# Patient Record
Sex: Female | Born: 1945 | Race: White | Hispanic: No | Smoking: Former smoker
Health system: Southern US, Community
[De-identification: ages and names within clinical notes are randomized; demographics above are authoritative.]

## PROBLEM LIST (undated history)

## (undated) DIAGNOSIS — G8929 Other chronic pain: Secondary | ICD-10-CM

## (undated) DIAGNOSIS — R42 Dizziness and giddiness: Secondary | ICD-10-CM

## (undated) DIAGNOSIS — E785 Hyperlipidemia, unspecified: Secondary | ICD-10-CM

## (undated) DIAGNOSIS — M1990 Unspecified osteoarthritis, unspecified site: Secondary | ICD-10-CM

## (undated) DIAGNOSIS — Z8542 Personal history of malignant neoplasm of other parts of uterus: Secondary | ICD-10-CM

## (undated) DIAGNOSIS — J449 Chronic obstructive pulmonary disease, unspecified: Secondary | ICD-10-CM

## (undated) DIAGNOSIS — F419 Anxiety disorder, unspecified: Secondary | ICD-10-CM

## (undated) DIAGNOSIS — I10 Essential (primary) hypertension: Secondary | ICD-10-CM

## (undated) DIAGNOSIS — Z972 Presence of dental prosthetic device (complete) (partial): Secondary | ICD-10-CM

## (undated) DIAGNOSIS — M797 Fibromyalgia: Secondary | ICD-10-CM

## (undated) HISTORY — PX: KNEE SURGERY: SHX244

---

## 2012-08-09 ENCOUNTER — Inpatient Hospital Stay: Payer: Self-pay | Admitting: Internal Medicine

## 2012-08-09 LAB — URINALYSIS, COMPLETE
Bilirubin,UR: NEGATIVE
Glucose,UR: NEGATIVE mg/dL (ref 0–75)
Leukocyte Esterase: NEGATIVE
Nitrite: NEGATIVE
RBC,UR: 1 /HPF (ref 0–5)
WBC UR: <1 /HPF (ref 0–5)

## 2012-08-09 LAB — CK TOTAL AND CKMB (NOT AT ARMC)
CK, Total: 54 U/L (ref 21–215)
CK-MB: <0.5 ng/mL — ABNORMAL LOW (ref 0.5–3.6)
CK-MB: 0.7 ng/mL (ref 0.5–3.6)

## 2012-08-09 LAB — CBC
MCH: 31.6 pg (ref 26.0–34.0)
MCHC: 34.7 g/dL (ref 32.0–36.0)
Platelet: 197 10*3/uL (ref 150–440)
RDW: 13.5 % (ref 11.5–14.5)
WBC: 8.2 10*3/uL (ref 3.6–11.0)

## 2012-08-09 LAB — COMPREHENSIVE METABOLIC PANEL
Alkaline Phosphatase: 97 U/L (ref 50–136)
Anion Gap: 9 (ref 7–16)
BUN: 17 mg/dL (ref 7–18)
Bilirubin,Total: 0.2 mg/dL (ref 0.2–1.0)
Calcium, Total: 8.5 mg/dL (ref 8.5–10.1)
Chloride: 105 mmol/L (ref 98–107)
EGFR (African American): >60
Potassium: 4.2 mmol/L (ref 3.5–5.1)
SGOT(AST): 24 U/L (ref 15–37)
SGPT (ALT): 18 U/L (ref 12–78)
Total Protein: 6.5 g/dL (ref 6.4–8.2)

## 2012-08-09 LAB — DRUG SCREEN, URINE
Amphetamines, Ur Screen: NEGATIVE (ref ?–1000)
Barbiturates, Ur Screen: NEGATIVE (ref ?–200)
Cocaine Metabolite,Ur ~~LOC~~: NEGATIVE (ref ?–300)
Methadone, Ur Screen: NEGATIVE (ref ?–300)
Tricyclic, Ur Screen: NEGATIVE (ref ?–1000)

## 2012-08-09 LAB — ETHANOL
Ethanol %: 0.157 % — ABNORMAL HIGH (ref 0.000–0.080)
Ethanol: 157 mg/dL

## 2012-08-09 LAB — TSH: Thyroid Stimulating Horm: 1.78 u[IU]/mL

## 2012-08-09 LAB — ACETAMINOPHEN LEVEL: Acetaminophen: 20 ug/mL

## 2012-08-10 LAB — CBC WITH DIFFERENTIAL/PLATELET
Basophil %: 0.6 %
Eosinophil #: 0.2 10*3/uL (ref 0.0–0.7)
Eosinophil %: 1.9 %
HCT: 35.9 % (ref 35.0–47.0)
HGB: 12.4 g/dL (ref 12.0–16.0)
Lymphocyte #: 2.7 10*3/uL (ref 1.0–3.6)
MCV: 92 fL (ref 80–100)
Monocyte %: 6.6 %
Neutrophil #: 6.4 10*3/uL (ref 1.4–6.5)
Platelet: 195 10*3/uL (ref 150–440)
RBC: 3.92 10*6/uL (ref 3.80–5.20)
WBC: 10 10*3/uL (ref 3.6–11.0)

## 2012-08-10 LAB — HEPATIC FUNCTION PANEL A (ARMC)
Albumin: 3.7 g/dL (ref 3.4–5.0)
Bilirubin, Direct: <0.05 mg/dL (ref 0.00–0.20)
Bilirubin,Total: 0.5 mg/dL (ref 0.2–1.0)
SGOT(AST): 19 U/L (ref 15–37)
Total Protein: 6.6 g/dL (ref 6.4–8.2)

## 2012-08-10 LAB — TROPONIN I: Troponin-I: <0.02 ng/mL

## 2012-08-10 LAB — BASIC METABOLIC PANEL
Calcium, Total: 8.8 mg/dL (ref 8.5–10.1)
Chloride: 109 mmol/L — ABNORMAL HIGH (ref 98–107)
Co2: 28 mmol/L (ref 21–32)
EGFR (Non-African Amer.): >60
Osmolality: 286 (ref 275–301)
Potassium: 4.5 mmol/L (ref 3.5–5.1)
Sodium: 144 mmol/L (ref 136–145)

## 2012-08-11 ENCOUNTER — Inpatient Hospital Stay: Payer: Self-pay | Admitting: Psychiatry

## 2015-04-18 NOTE — Consult Note (Signed)
PATIENT NAME:  Caroline Miller, Caroline Miller MR#:  [id number] DATE OF BIRTH:  [birth year]  DATE OF CONSULTATION:  08/10/2012  REFERRING PHYSICIAN:  Katharina Caper, MD   CONSULTING PHYSICIAN:  Jolanta B. Pucilowska, MD  REASON FOR CONSULTATION: To evaluate a patient after a suicide attempt.   IDENTIFYING DATA: Ms. Weatherwax is a 69 year old female with history of depression and alcohol abuse.   CHIEF COMPLAINT: "It is my son."   HISTORY OF PRESENT ILLNESS: Ms. Schifano reports that she has not had a history of depression or anxiety. She also denies any problems with alcohol, illicit drugs, or prescription pills. She moved to [place] from [place] five years ago to be closer to her son. Initially, they lived independently. Later on, the son decided to buy a house and convinced the mother to move in with him. Apparently the relationship was bearable until six months ago when he moved in his girlfriend to live with them. There is a continuous conflict between the mother and the girlfriend. The patient feels that the new girlfriend is disrespectful and does not care about her. She denies drinking and denies having any problems herself. Her son, however, reports that the patient came from [place] with a gun and lately has been threatening suicide frequently, especially on her 69th birthday. She also keeps the gun around, and on the day of admission she hid the gun under the couch while threatening suicide when drunk. When the patient went to take care of commitment papers, the patient overdosed on painkillers that belonged to him. She was brought to the Emergency Room and had to be admitted to the Critical Care Unit. The patient denies any symptoms of depression, anxiety, or psychosis. She is just extremely upset with the girlfriend. She is uncertain whether she wants to stay in her son's house; however, it seems that they have to put their resources together in order to afford this house. She pays him over $500 each month. She believes  that she could easily find an apartment for the same price.   PAST PSYCHIATRIC HISTORY: She denies any prior hospitalization, suicide attempts or substance abuse. She has not been prescribed medications for depression. She has Ambien that is prescribed by her primary provider. She also receives pain medications from the same provider. She did not want to tell me the name as she fears that I would call the doctor and make her stop prescribing pain medication.   FAMILY PSYCHIATRIC HISTORY: None reported.  PAST MEDICAL HISTORY:  1. Hypertension. 2. Chronic pain.                ALLERGIES: No known drug allergies.   MEDICATIONS ON ADMISSION:  1. Vicodin 5/325, 1 to 2 tablets as needed. She has 60 a month. 2. Hydrochlorothiazide 12.5 mg daily.  3. Lisinopril 40 mg daily.  4. Omeprazole 20 mg twice daily.  5. Ambien 10 mg at bedtime.   SOCIAL HISTORY: As above, she is widowed. She has been in [place] for five years. She is rather unhappy with her current arrangements here. She has Medicare.   REVIEW OF SYSTEMS: CONSTITUTIONAL: No fevers or chills. No weight changes. EYES: No double or blurred vision. ENT: No hearing loss. RESPIRATORY: No shortness of breath or cough. CARDIOVASCULAR: No chest pain or orthopnea. GASTROINTESTINAL: No abdominal pain, nausea, vomiting, or diarrhea. GU: No incontinence or frequency. ENDOCRINE: No heat or cold intolerance. LYMPHATIC: No anemia or easy bruising. INTEGUMENTARY: No acne or rash. MUSCULOSKELETAL: Positive for muscle and joint pain  all over. NEUROLOGIC: No tingling or weakness. PSYCHIATRIC: See history of present illness for details.   PHYSICAL EXAMINATION:  VITAL SIGNS: Blood pressure 131/74, pulse 74, respirations 14, temperature 98.2.   GENERAL: This is a slightly obese female in no acute distress. The rest of the physical examination is deferred to her primary attending.   LABORATORY, DIAGNOSTIC AND RADIOLOGICAL DATA: Chemistries are within normal  limits. Blood alcohol level on admission 0.157. LFTs within normal limits. Cardiac enzymes negative x3. TSH 1.78. Urine tox screen positive for opiates. CBC within normal limits. Urinalysis is not suggestive of urinary tract infection. Serum acetaminophen and salicylates are low. EKG: Normal sinus rhythm, normal EKG.   MENTAL STATUS EXAMINATION: The patient is alert and oriented to person, place, time, and situation. She is pleasant, polite, and cooperative. She is in the Critical Care Unit wearing a hospital gown. She maintains good eye contact. Her speech is soft. Mood is depressed with flat affect. Thought processing is logical and goal oriented. Thought content: She denies suicidal or homicidal ideation but was admitted after a suicide attempt by pain killer overdose. She has no delusions or paranoia. There are no auditory or visual hallucinations. Her cognition is grossly intact. She registers three out of three and recalls three out of three objects after five minutes. She can spell world forward and backward. She knows the current president. Her insight and judgment are questionable.   SUICIDE RISK ASSESSMENT: This is a patient with a new onset depression who is also drinking and who overdosed on medications while drunk and upset with her son.   DIAGNOSES:  AXIS I:  1. Adjustment disorder with depressed mood. 2. Alcohol abuse.   AXIS II: Deferred.   AXIS III:  1. Hypertension.  2. Fibromyalgia.   AXIS IV: Mental illness, substance abuse, family conflict.    AXIS V: Global Assessment of Functioning score is 25.   PLAN:  1. The patient is not glad that she is alive. Please continue sitter.  2. We will transfer to Psychiatry as soon as a bed is available.   ____________________________ Ellin GoodieJolanta B. Jennet MaduroPucilowska, MD jbp:cbb D: 08/10/2012 19:50:05 ET T: 08/11/2012 10:25:45 ET JOB#: [id number]  cc: Jolanta B. Jennet MaduroPucilowska, MD, <Dictator> Shari ProwsJOLANTA B PUCILOWSKA MD ELECTRONICALLY SIGNED  08/13/2012 23:03

## 2015-04-18 NOTE — Discharge Summary (Signed)
PATIENT NAME:  Caroline Miller, Caroline Miller MR#:  [id number] DATE OF BIRTH:  July 03, 1946  DATE OF ADMISSION:  08/09/2012 DATE OF DISCHARGE:  08/11/2012  ADMITTING DIAGNOSES:  1. Suicidal ideation. 2. Alcohol dependence. 3. Altered mental status secondary to alcohol intoxication as well as drug overdose with Ambien and narcotics.   DISCHARGE DIAGNOSES:  1. Metabolic encephalopathy, resolved. 2. Alcohol as well as opiate intoxication and Ambien overdose, suicidal ideation.  3. Anemia.  4. Acidosis metabolic. 5. Relative hypotension.  6. Mild obesity.  7. History of hypertension.   DISCHARGE CONDITION: Stable.   DISCHARGE MEDICATIONS: Patient is to resume her outpatient medications which are:  Omeprazole 20 mg p.o. twice daily.   ADDITIONAL MEDICATIONS: Patient is to hold lisinopril as well as HCTZ for relatively low normal blood pressure. Resume blood pressure medications according to medical doctors recommendations.  DIET: 2 gram salt, low fat, low cholesterol, regular consistency.   ACTIVITY LIMITATIONS: As tolerated.   FOLLOW UP: Follow-up appointment with [facility] in two days after discharge.   CONSULTANTS:  1. Dr. Jennet MaduroPucilowska. 2. Care management.   HISTORY OF PRESENT ILLNESS: Patient is a 69 year old Caucasian female with past medical history significant for history of hypertension, depression, alcohol dependence who presented to the hospital with altered mental status as well as suicidal ideation. Apparently she was depressed and felt that nobody was taking care of her. She drank alcohol and had conflicts with her son. Please refer to Dr. Antionette PolesAbrol's admission note on 08/09/2012. Patient wanted to end it all and she was about to harm herself with gun but son confiscated gun so she decided to overdose on prescription medications. She took some narcotic medications as well as her Ambien. On arrival to Emergency Room patient's temperature was not measured, pulse 94, respiration rate 20, blood  pressure 115/57, physical exam was unremarkable.   LABORATORY, DIAGNOSTIC AND RADIOLOGICAL DATA: Chest, portable single view 08/09/2012 showed shallow inspiration with presumed lung base atelectasis.   Lab data done on 08/09/2012 showed normal BMP, elevated alcohol level to 0.157%. Estimated GFR was somewhat low to 52, otherwise unremarkable. Liver enzymes were normal. Cardiac enzymes, first set, as well as subsequent two more sets, were within limits. TSH was normal at 1.78. Urine drug screen was positive for opiates. CBC: White blood cell count 8.2, hemoglobin 11.9, platelet count 197. Urinalysis was within normal limits. Acetaminophen level 20. Salicylate level less than 1.7. ABGs were done on 32% FiO2; pH found to be 7.36, pCO2 40, pO2 124, saturation 98.4%. Lactic acid level was elevated to 2.0, bicarbonate level 25. EKG showed normal sinus rhythm at 80 beats per minute, normal axis, no acute ST-T changes were noted. Chest x-ray was unremarkable.  HOSPITAL COURSE: Patient was admitted to the Intensive Care Unit because of altered mental status, some somnolence as well as intermittent bradycardia. Patient was observed, treated and her opiates as well as Ambien were placed on hold. Because of her blood pressure being low her blood pressure medications were also placed on hold. She was given intermittent Narcan injections with improvement of her mental condition. She was observed and had sitter in the room and was seen by Dr. Jennet MaduroPucilowska, psychiatrist, on 08/10/2012. Dr. Jennet MaduroPucilowska felt that patient has major depressive disorder. Patient overdosed while drunk in the context of family conflict. She was also not glad to be alive. Dr. Jennet MaduroPucilowska recommended to continue sitter and transfer to psychiatry bed as soon as bed is available. A bed became available on 08/11/2012 and patient is going  to be transferred to Dr. Willia Craze care today. On the day of discharge patient's vital signs: Temperature 98.7, pulse  68, respiration rate 18, blood pressure 104/65, saturation 98% on room air at rest. It was felt that patient's blood pressure medications should be kept on hold until her blood pressure improved.   TIME SPENT: 30 minutes.   ____________________________ Katharina Caper, MD rv:cms D: 08/11/2012 08:34:35 ET T: 08/11/2012 09:17:27 ET JOB#: [id number]  cc: Katharina Caper, MD, <Dictator> [facility] Rhea Kaelin MD ELECTRONICALLY SIGNED 08/13/2012 14:49

## 2015-04-18 NOTE — H&P (Signed)
PATIENT NAME:  Caroline Miller, Caroline Miller MR#:  [id number] DATE OF BIRTH:  06-02-1946  DATE OF ADMISSION:  08/09/2012  PRIMARY CARE PHYSICIAN:  None locally.  CHIEF COMPLAINT: Suicidal ideation.   SUBJECTIVE: This is a 69 year old female who was brought to the ER after being committed by her son. She has a history of alcohol dependence and has a history of depression and previous suicidal ideation. She has been committed in the past. However, the patient has also been living in [place] and recently moved to [place] to live with her son.  Apparently she was able to obtain a gun in [place]. The patient has been depressed and feels that there is nobody to take care of her.  The patient's son states that he took her into his home. However, the situation has been extremely difficult for him because the patient tends to get suicidal especially around her birthday. She tends to drink more than usual and continues to throw tantrums as well as continuously threatens that she is going to end her life.  Today the patient stated that she wanted to end it all and had a gun hiding underneath her couch. Her son found the gun and confiscated it. When he left the house to obtain the commitment papers, the patient decided to overdose on prescription medications. The son states that he had some narcotic medication in his medicine closet from a previous abdominal surgery and the patient had those pills in her possession and she overdosed on them. She is also supposed to be on antidepressant medications, but has been noncompliant with them.   PAST MEDICAL HISTORY:  1. Hypertension.  2. Depression.  3. Alcohol dependence.   PAST SURGICAL HISTORY: None.   ALLERGIES: No known drug allergies.   FAMILY HISTORY: Unable to be obtained given the patient's altered mental status.   REVIEW OF SYSTEMS: Again, unable to be obtained given the patient's altered mental status.   SOCIAL HISTORY: The patient lives with her son. Currently  drinks 6 to 12 beers per day. Denies any history of ongoing tobacco use.   HOME MEDICATIONS:  1. Vicodin 5/325, 1 to 2 tablets as needed. 2. HCTZ 12.5 mg p.o. daily.  3. Lisinopril 40 mg p.o. daily.  4. Omeprazole 20 mg p.o. twice daily. 5. Ambien 10 mg 1 tablet at bedtime.   PHYSICAL EXAMINATION:  VITAL SIGNS: Blood pressure 113/57, pulse 94, respirations 20.   GENERAL: Currently the patient refuses to talk to anybody, is awake and easily arousable. Sclerae anicteric.   NECK: Supple. No JVD. Mucous membranes are dry.   HEENT: Atraumatic, normocephalic.  Oropharynx is clear. Pupils are equal and reactive.   NECK: Trachea in the midline. Supple. No thyromegaly. No lymphadenopathy.   LUNGS: Clear to auscultation with normal respiratory effort. No accessory muscle use.   CARDIOVASCULAR: Regular rate and rhythm. No murmurs, rubs, or gallops. PMI is nondisplaced.   ABDOMEN: Soft, nontender, nondistended. No hepatosplenomegaly. Normoactive bowel sounds.   EXTREMITIES: Without dependent edema.   SKIN: Normal temperature and turgor. No rashes. No ulcers. No subcutaneous nodules.   PSYCHIATRIC: Appropriate mood and affect. Oriented to time, place, and person.   LABORATORY DATA: Tylenol level was 20, glucose 89, BUN 17, creatinine 1.17, sodium 139, potassium 4.2, chloride 105, ALT 18, AST 24. EtOH level is 157, WBC 8.2, hemoglobin 11.9, hematocrit 34.4, platelet count 197. TSH is 1.78. Urine drug screen positive for opiates.  Urinalysis negative.   ABG shows a pH of 7.36, pCO2 40, pO2  124.   ASSESSMENT AND PLAN:  1. Suicidal ideation.  2. Alcohol dependence.  3. Altered mental status secondary to probably EtOH intoxication as well as drug overdose with Ambien as well as narcotics.   PLAN:  1. The patient received a couple of doses of Narcan in the ER. The patient also had an NG tube placed and had a gastric aspiration done. The patient has been placed on involuntary commitment.   2. We will admit her to Intensive Care Unit and monitor her closely neurologically because of her unknown history of drug overdose. At this point it is not quite clear what exactly she has taken. We will hold her antihypertensive medications in case the patient has overdosed on her blood pressure medications to avoid hypotension.  3. Will keep her n.p.o. until the patient is awake, alert, cooperative to decrease the risk of aspiration.  4. We will keep her in restraints overnight.  5. Start her on CIWA protocol.  6. Discussed the plan of care with her son.  7. CODE STATUS: She is a FULL CODE.   TIME SPENT: 90 minutes.   ____________________________ Richarda OverlieNayana Leeya Rusconi, MD na:bjt D: 08/09/2012 18:12:27 ET T: 08/10/2012 07:19:30 ET JOB#: [id number]  cc: Richarda OverlieNayana Tajae Maiolo, MD, <Dictator> Richarda OverlieNAYANA Orson Rho MD ELECTRONICALLY SIGNED 08/17/2012 0:52

## 2015-04-18 NOTE — Consult Note (Signed)
Brief Consult Note: Diagnosis: Major depressive disorder.   Patient was seen by consultant.   Consult note dictated.   Recommend further assessment or treatment.   Orders entered.   Discussed with Attending MD.   Comments: Ms. Lemaster overdosed on medications while drunk in the context of family conflict.She is not glad to be alive.   PLAN: 1. Please continue sitter.  2. will transfer to psychiatry as soon as bed available.  Electronic Signatures: Kristine LineaPucilowska, Kyrin Garn (MD)  (Signed 12-Aug-13 19:40)  Authored: Brief Consult Note   Last Updated: 12-Aug-13 19:40 by Kristine LineaPucilowska, Sarae Nicholes (MD)

## 2015-05-09 NOTE — H&P (Signed)
PATIENT NAME:  Caroline Miller, Caroline Miller [id number] OF BIRTH:  08/13/1946 OF ADMISSION:  08/11/2012 PHYSICIAN:  Katharina Caperima Vaickute, MD  PHYSICIAN:  Gertude Benito B. Mac Dowdell, MD DATA: Ms. Caroline Miller is a 69 year old female with history of depression and alcohol abuse.  COMPLAINT: "It is my son."  OF PRESENT ILLNESS: Ms. Caroline Miller reports that she has not had a history of depression or anxiety. She also denies any problems with alcohol, illicit drugs, or prescription pills. She moved to [place] ago to be closer to her son. Initially, they lived independently. Later on, the son decided to buy a house and convinced the mother to move in with him. Apparently the relationship was bearable until six months ago when he moved in his girlfriend to live with them. There is a continuous conflict between the mother and the girlfriend. The patient feels that the new girlfriend is disrespectful and does not care about her. She denies drinking and denies having any problems herself. Her son, however, reports that the patient came from [place] with a gun and lately has been threatening suicide frequently, especially on her birthday. She also keeps the gun around, and on the day of admission she hid the gun under the couch while threatening suicide when drunk. When the patient went to take care of commitment papers, the patient overdosed on painkillers that belonged to him. She was brought to the Emergency Room and had to be admitted to the Critical Care Unit. The patient denies any symptoms of depression, anxiety, or psychosis. She is just extremely upset with the girlfriend. She is uncertain whether she wants to stay in her son's house; however, it seems that they have to put their resources together in order to afford this house. She pays him over $500 each month. She believes that she could easily find an apartment for the same price.  PSYCHIATRIC HISTORY: She denies any prior hospitalization, suicide attempts or substance abuse.  She has not been prescribed medications for depression. She has Ambien that is prescribed by her primary provider. She also receives pain medications from the same provider. She did not want to tell me the name as she fears that I would call the doctor and make her stop prescribing pain medication.  PSYCHIATRIC HISTORY: None reported. MEDICAL HISTORY:  1. Hypertension. 2. Chronic pain.               No known drug allergies.  ON ADMISSION:  1. Omeprazole 20 mg twice daily.  HISTORY: As above, she is widowed. She has been in [place] for five years. She is rather unhappy with her current arrangements here. She has Medicare.  OF SYSTEMS: CONSTITUTIONAL: No fevers or chills. No weight changes. EYES: No double or blurred vision. ENT: No hearing loss. RESPIRATORY: No shortness of breath or cough. CARDIOVASCULAR: No chest pain or orthopnea. GASTROINTESTINAL: No abdominal pain, nausea, vomiting, or diarrhea. GU: No incontinence or frequency. ENDOCRINE: No heat or cold intolerance. LYMPHATIC: No anemia or easy bruising. INTEGUMENTARY: No acne or rash. MUSCULOSKELETAL: Positive for muscle and joint pain all over. NEUROLOGIC: No tingling or weakness. PSYCHIATRIC: See history of present illness for details.   PHYSICAL EXAMINATION: VITAL SIGNS: Blood pressure 131/74, pulse 74, respirations 14, temperature 98.2. GENERAL: This is a slightly obese female in no acute distress. HEENT: The pupils are equal, round, and reactive to light. Sclerae anicteric. NECK: Supple. No thyromegaly. LUNGS: Clear to auscultation. No dullness to percussion. HEART: Regular rhythm and rate. No murmurs, rubs, or gallops. ABDOMEN:  Soft, nontender, nondistended. Positive bowel sounds. MUSCULOSKELETAL: Normal muscle strength in all extremities. SKIN: No rashes or bruises. LYMPHATIC: No cervical adenopathy. NEUROLOGIC: Cranial nerves II through XII are intact.  LABORATORY, DIAGNOSTIC AND RADIOLOGICAL DATA: Chemistries are within normal  limits. Blood alcohol level on admission 0.157. LFTs within normal limits. Cardiac enzymes negative x3. TSH 1.78. Urine tox screen positive for opiates. CBC within normal limits. Urinalysis is not suggestive of urinary tract infection. Serum acetaminophen and salicylates are low. EKG: Normal sinus rhythm, normal EKG.  STATUS EXAMINATION ON ADMISSION: The patient is alert and oriented to person, place, time, and situation. She is pleasant, polite, and cooperative. She is in the Critical Care Unit wearing a hospital gown. She maintains good eye contact. Her speech is soft. Mood is depressed with flat affect. Thought processing is logical and goal oriented. Thought content: She denies suicidal or homicidal ideation but was admitted after a suicide attempt by pain killer overdose. She has no delusions or paranoia. There are no auditory or visual hallucinations. Her cognition is grossly intact. She registers three out of three and recalls three out of three objects after five minutes. She can spell world forward and backward. She knows the current president. Her insight and judgment are questionable.  RISK ASSESSMENT ON ADMISSION: This is a patient with a new onset depression who is also drinking and who overdosed on medications while drunk and upset with her son.  DIAGNOSES: I:  Adjustment disorder with depressed mood.    Alcohol abuse.  AXIS II:  Deferred. III:  Hypertension, Fibromyalgia, GERD.IV:  Mental illness, substance abuse, family conflict. V:  Global Assessment of Functioning on admission 25.   PLAN: The patient was admitted to [facility] Medicine unit for safety, stabilization, and medication management. She was initially placed on suicide precautions and was closely monitored for any unsafe behaviors. She underwent full psychiatric and risk assessment. She received pharmacotherapy, individual and group psychotherapy, substance abuse counseling, and support from  therapeutic milieu.  1. Suicidal ideation: This ha sresolved. The patient is able to contract for safety.   2. Mood. We will start Paxil for depression and Trazodone for insomnia.   Medical. The patient has a history of hypertension. Her antihypertensives were discontinue in CCU due to low blood pressure. We will monitor.   Substance abuse treatment. The patient denies heavy drinking. She does not need detox. She minimizes her problem and declines treatment.   5. Discharge planning. She does not want to return to her son's place and wants to find an apartment.    Electronic Signatures: Kristine LineaPucilowska, Faustine Tates (MD)  (Signed on 14-Aug-13 17:49)  Authored  Last Updated: 14-Aug-13 17:49 by Kristine LineaPucilowska, Olander Friedl (MD)

## 2020-09-29 ENCOUNTER — Inpatient Hospital Stay
Admission: EM | Admit: 2020-09-29 | Discharge: 2020-10-03 | DRG: 281 | Disposition: A | Discharge status: Home / Self Care | Payer: Medicare HMO | Attending: Internal Medicine | Admitting: Internal Medicine

## 2020-09-29 ENCOUNTER — Observation Stay: Payer: Medicare HMO

## 2020-09-29 ENCOUNTER — Encounter: Payer: Self-pay | Admitting: Emergency Medicine

## 2020-09-29 ENCOUNTER — Emergency Department: Payer: Medicare HMO

## 2020-09-29 ENCOUNTER — Observation Stay (HOSPITAL_COMMUNITY)
Admit: 2020-09-29 | Discharge: 2020-09-29 | Disposition: A | Discharge status: Home / Self Care | Payer: Medicare HMO | Attending: Internal Medicine | Admitting: Internal Medicine

## 2020-09-29 DIAGNOSIS — I10 Essential (primary) hypertension: Secondary | ICD-10-CM | POA: Diagnosis present

## 2020-09-29 DIAGNOSIS — F32A Depression, unspecified: Secondary | ICD-10-CM | POA: Diagnosis present

## 2020-09-29 DIAGNOSIS — E871 Hypo-osmolality and hyponatremia: Secondary | ICD-10-CM | POA: Diagnosis present

## 2020-09-29 DIAGNOSIS — R4182 Altered mental status, unspecified: Secondary | ICD-10-CM

## 2020-09-29 DIAGNOSIS — N179 Acute kidney failure, unspecified: Secondary | ICD-10-CM | POA: Diagnosis present

## 2020-09-29 DIAGNOSIS — I361 Nonrheumatic tricuspid (valve) insufficiency: Secondary | ICD-10-CM | POA: Diagnosis not present

## 2020-09-29 DIAGNOSIS — I214 Non-ST elevation (NSTEMI) myocardial infarction: Secondary | ICD-10-CM | POA: Diagnosis present

## 2020-09-29 DIAGNOSIS — Z6837 Body mass index (BMI) 37.0-37.9, adult: Secondary | ICD-10-CM

## 2020-09-29 DIAGNOSIS — I340 Nonrheumatic mitral (valve) insufficiency: Secondary | ICD-10-CM | POA: Diagnosis not present

## 2020-09-29 DIAGNOSIS — E876 Hypokalemia: Secondary | ICD-10-CM | POA: Diagnosis not present

## 2020-09-29 DIAGNOSIS — I5181 Takotsubo syndrome: Secondary | ICD-10-CM | POA: Diagnosis not present

## 2020-09-29 DIAGNOSIS — R569 Unspecified convulsions: Secondary | ICD-10-CM

## 2020-09-29 DIAGNOSIS — Z20822 Contact with and (suspected) exposure to covid-19: Secondary | ICD-10-CM | POA: Diagnosis present

## 2020-09-29 DIAGNOSIS — G894 Chronic pain syndrome: Secondary | ICD-10-CM | POA: Diagnosis present

## 2020-09-29 DIAGNOSIS — Z79891 Long term (current) use of opiate analgesic: Secondary | ICD-10-CM

## 2020-09-29 DIAGNOSIS — I4892 Unspecified atrial flutter: Secondary | ICD-10-CM | POA: Diagnosis present

## 2020-09-29 DIAGNOSIS — E7800 Pure hypercholesterolemia, unspecified: Secondary | ICD-10-CM | POA: Diagnosis present

## 2020-09-29 DIAGNOSIS — Z1389 Encounter for screening for other disorder: Secondary | ICD-10-CM

## 2020-09-29 DIAGNOSIS — G459 Transient cerebral ischemic attack, unspecified: Secondary | ICD-10-CM | POA: Diagnosis present

## 2020-09-29 DIAGNOSIS — E785 Hyperlipidemia, unspecified: Secondary | ICD-10-CM | POA: Diagnosis present

## 2020-09-29 DIAGNOSIS — Z0189 Encounter for other specified special examinations: Secondary | ICD-10-CM

## 2020-09-29 DIAGNOSIS — G9340 Encephalopathy, unspecified: Secondary | ICD-10-CM | POA: Clinically undetermined

## 2020-09-29 HISTORY — DX: Takotsubo syndrome: I51.81

## 2020-09-29 HISTORY — DX: Unspecified convulsions: R56.9

## 2020-09-29 LAB — BLOOD GAS, VENOUS
Acid-Base Excess: 1 mmol/L (ref 0.0–2.0)
Bicarbonate: 27.5 mmol/L (ref 20.0–28.0)
FIO2: 0.21
O2 Saturation: 54.9 %
Patient temperature: 37
pCO2, Ven: 51 mmHg (ref 44.0–60.0)
pH, Ven: 7.34 (ref 7.250–7.430)
pO2, Ven: 31 mmHg — CL (ref 32.0–45.0)

## 2020-09-29 LAB — URINALYSIS, ROUTINE W REFLEX MICROSCOPIC
Bacteria, UA: NONE SEEN
Bilirubin Urine: NEGATIVE
Glucose, UA: NEGATIVE mg/dL
Hgb urine dipstick: NEGATIVE
Ketones, ur: NEGATIVE mg/dL
Nitrite: NEGATIVE
Protein, ur: NEGATIVE mg/dL
Specific Gravity, Urine: 1.012 (ref 1.005–1.030)
pH: 8 (ref 5.0–8.0)

## 2020-09-29 LAB — DIFFERENTIAL
Abs Immature Granulocytes: 0.03 10*3/uL (ref 0.00–0.07)
Basophils Absolute: 0.1 10*3/uL (ref 0.0–0.1)
Basophils Relative: 1 %
Eosinophils Absolute: 0.2 10*3/uL (ref 0.0–0.5)
Eosinophils Relative: 2 %
Immature Granulocytes: 0 %
Lymphocytes Relative: 28 %
Lymphs Abs: 2.2 10*3/uL (ref 0.7–4.0)
Monocytes Absolute: 0.5 10*3/uL (ref 0.1–1.0)
Monocytes Relative: 7 %
Neutro Abs: 5 10*3/uL (ref 1.7–7.7)
Neutrophils Relative %: 62 %

## 2020-09-29 LAB — URINE DRUG SCREEN, QUALITATIVE (ARMC ONLY)
Amphetamines, Ur Screen: NOT DETECTED
Barbiturates, Ur Screen: NOT DETECTED
Benzodiazepine, Ur Scrn: NOT DETECTED
Cannabinoid 50 Ng, Ur ~~LOC~~: NOT DETECTED
Cocaine Metabolite,Ur ~~LOC~~: NOT DETECTED
MDMA (Ecstasy)Ur Screen: NOT DETECTED
Methadone Scn, Ur: NOT DETECTED
Opiate, Ur Screen: POSITIVE — AB
Phencyclidine (PCP) Ur S: NOT DETECTED
Tricyclic, Ur Screen: NOT DETECTED

## 2020-09-29 LAB — RESPIRATORY PANEL BY RT PCR (FLU A&B, COVID)
Influenza A by PCR: NEGATIVE
Influenza B by PCR: NEGATIVE
SARS Coronavirus 2 by RT PCR: NEGATIVE

## 2020-09-29 LAB — COMPREHENSIVE METABOLIC PANEL
ALT: 17 U/L (ref 0–44)
AST: 28 U/L (ref 15–41)
Albumin: 4.4 g/dL (ref 3.5–5.0)
Alkaline Phosphatase: 48 U/L (ref 38–126)
Anion gap: 13 (ref 5–15)
BUN: 9 mg/dL (ref 8–23)
CO2: 20 mmol/L — ABNORMAL LOW (ref 22–32)
Calcium: 9.1 mg/dL (ref 8.9–10.3)
Chloride: 96 mmol/L — ABNORMAL LOW (ref 98–111)
Creatinine, Ser: 1.18 mg/dL — ABNORMAL HIGH (ref 0.44–1.00)
GFR calc Af Amer: 32 mL/min — ABNORMAL LOW (ref >60)
GFR calc non Af Amer: 28 mL/min — ABNORMAL LOW (ref >60)
Glucose, Bld: 113 mg/dL — ABNORMAL HIGH (ref 70–99)
Potassium: 3.4 mmol/L — ABNORMAL LOW (ref 3.5–5.1)
Sodium: 129 mmol/L — ABNORMAL LOW (ref 135–145)
Total Bilirubin: 0.9 mg/dL (ref 0.3–1.2)
Total Protein: 6.9 g/dL (ref 6.5–8.1)

## 2020-09-29 LAB — ECHOCARDIOGRAM COMPLETE
AR max vel: 1.77 cm2
AV Area VTI: 1.79 cm2
AV Area mean vel: 1.82 cm2
AV Mean grad: 3 mmHg
AV Peak grad: 4.8 mmHg
Ao pk vel: 1.1 m/s
Area-P 1/2: 7.66 cm2
Height: 66 in
S' Lateral: 2.73 cm
Weight: 3513.25 oz

## 2020-09-29 LAB — CBC
HCT: 34.2 % — ABNORMAL LOW (ref 36.0–46.0)
Hemoglobin: 12.2 g/dL (ref 12.0–15.0)
MCH: 30 pg (ref 26.0–34.0)
MCHC: 35.7 g/dL (ref 30.0–36.0)
MCV: 84 fL (ref 80.0–100.0)
Platelets: 253 10*3/uL (ref 150–400)
RBC: 4.07 MIL/uL (ref 3.87–5.11)
RDW: 12 % (ref 11.5–15.5)
WBC: 8 10*3/uL (ref 4.0–10.5)
nRBC: 0 % (ref 0.0–0.2)

## 2020-09-29 LAB — GLUCOSE, CAPILLARY: Glucose-Capillary: 130 mg/dL — ABNORMAL HIGH (ref 70–99)

## 2020-09-29 LAB — APTT: aPTT: 25 seconds (ref 24–36)

## 2020-09-29 LAB — PROTIME-INR
INR: 0.9 (ref 0.8–1.2)
Prothrombin Time: 12 seconds (ref 11.4–15.2)

## 2020-09-29 LAB — ETHANOL: Alcohol, Ethyl (B): <10 mg/dL (ref <10)

## 2020-09-29 MED ORDER — ONDANSETRON HCL 4 MG/2ML IJ SOLN: 4.0000 mg | Freq: Four times a day (QID) | INTRAMUSCULAR | Status: DC | PRN

## 2020-09-29 MED ORDER — SODIUM CHLORIDE 0.9 % IV SOLN
2000.0000 mg | Freq: Once | INTRAVENOUS | Status: AC
  Administered 2020-09-29: 2000 mg via INTRAVENOUS
  Filled 2020-09-29: qty 20

## 2020-09-29 MED ORDER — LEVETIRACETAM 500 MG PO TABS
500.0000 mg | ORAL_TABLET | Freq: Two times a day (BID) | ORAL | Status: DC
  Administered 2020-09-29 – 2020-10-03 (×8): 500 mg via ORAL
  Filled 2020-09-29 (×8): qty 1

## 2020-09-29 MED ORDER — ACETAMINOPHEN 650 MG RE SUPP: 650.0000 mg | RECTAL | Status: DC | PRN

## 2020-09-29 MED ORDER — ONDANSETRON HCL 4 MG PO TABS: 4.0000 mg | ORAL_TABLET | Freq: Four times a day (QID) | ORAL | Status: DC | PRN

## 2020-09-29 MED ORDER — ACETAMINOPHEN 325 MG PO TABS
650.0000 mg | ORAL_TABLET | ORAL | Status: DC | PRN
  Administered 2020-09-29 – 2020-09-30 (×2): 650 mg via ORAL
  Filled 2020-09-29 (×2): qty 2

## 2020-09-29 MED ORDER — LEVETIRACETAM IN NACL 500 MG/100ML IV SOLN: 500.0000 mg | Freq: Two times a day (BID) | INTRAVENOUS | Status: DC

## 2020-09-29 MED ORDER — LORAZEPAM 2 MG/ML IJ SOLN
2.0000 mg | Freq: Once | INTRAMUSCULAR | Status: AC
  Administered 2020-09-29: 2 mg via INTRAVENOUS

## 2020-09-29 MED ORDER — IOHEXOL 350 MG/ML SOLN
75.0000 mL | Freq: Once | INTRAVENOUS | Status: AC | PRN
  Administered 2020-09-29: 75 mL via INTRAVENOUS

## 2020-09-29 MED ORDER — LORAZEPAM 2 MG/ML IJ SOLN: 1.0000 mg | INTRAMUSCULAR | Status: DC | PRN

## 2020-09-29 MED ORDER — POTASSIUM CHLORIDE IN NACL 20-0.45 MEQ/L-% IV SOLN
INTRAVENOUS | Status: DC
  Filled 2020-09-29 (×2): qty 1000

## 2020-09-29 MED ORDER — SODIUM CHLORIDE 0.9 % IV SOLN
75.0000 mL/h | INTRAVENOUS | Status: DC
  Administered 2020-09-30 (×2): 75 mL/h via INTRAVENOUS

## 2020-09-29 MED ORDER — ENOXAPARIN SODIUM 40 MG/0.4ML ~~LOC~~ SOLN
40.0000 mg | SUBCUTANEOUS | Status: DC
  Administered 2020-09-29: 40 mg via SUBCUTANEOUS
  Filled 2020-09-29: qty 0.4

## 2020-09-29 NOTE — Progress Notes (Signed)
eeg done °

## 2020-09-29 NOTE — ED Notes (Signed)
Attempted to call report to floor 

## 2020-09-29 NOTE — ED Notes (Signed)
Pt emergency contact at bedside. Requesting to speak to MD for update. MD paged.

## 2020-09-29 NOTE — ED Notes (Signed)
Pt appears to be more alert than previously. Pt is able to follow commands. Pt states that her name is Caroline Miller but is unable to tell this RN what her last name is. Pt states that her birthday is in [birth year] but is unable to tell RN what day and year.  Safety sitter remains at bedside with pt

## 2020-09-29 NOTE — ED Notes (Signed)
Pt to EEG.

## 2020-09-29 NOTE — Progress Notes (Signed)
MEDICATION RELATED CONSULT NOTE - INITIAL   Pharmacy Consult for  Drug interaction and monitoring of antiepileptic medications Indication: possible seizure  Not on File  Patient Measurements: Height: 5\' 6"  (167.6 cm) Weight: 99.6 kg (219 lb 9.3 oz) IBW/kg (Calculated) : 59.3 Adjusted Body Weight:    Vital Signs: BP: 125/112 (10/01 0930) Pulse Rate: 66 (10/01 0930) Intake/Output from previous day: No intake/output data recorded. Intake/Output from this shift: No intake/output data recorded.  Labs: Recent Labs    09/29/20 0635  WBC 8.0  HGB 12.2  HCT 34.2*  PLT 253  APTT 25  CREATININE 1.18*  ALBUMIN 4.4  PROT 6.9  AST 28  ALT 17  ALKPHOS 48  BILITOT 0.9   Estimated Creatinine Clearance: -3 mL/min (A) (by C-G formula based on SCr of 1.18 mg/dL (H)).   Microbiology: Recent Results (from the past 720 hour(s))  Respiratory Panel by RT PCR (Flu A&B, Covid) - Nasopharyngeal Swab     Status: None   Collection Time: 09/29/20  8:05 AM   Specimen: Nasopharyngeal Swab  Result Value Ref Range Status   SARS Coronavirus 2 by RT PCR NEGATIVE NEGATIVE Final    Comment: (NOTE) SARS-CoV-2 target nucleic acids are NOT DETECTED.  The SARS-CoV-2 RNA is generally detectable in upper respiratoy specimens during the acute phase of infection. The lowest concentration of SARS-CoV-2 viral copies this assay can detect is 131 copies/mL. A negative result does not preclude SARS-Cov-2 infection and should not be used as the sole basis for treatment or other patient management decisions. A negative result may occur with  improper specimen collection/handling, submission of specimen other than nasopharyngeal swab, presence of viral mutation(s) within the areas targeted by this assay, and inadequate number of viral copies (<131 copies/mL). A negative result must be combined with clinical observations, patient history, and epidemiological information. The expected result is  Negative.  Fact Sheet for Patients:  11/29/20  Fact Sheet for Healthcare Providers:  https://www.moore.com/  This test is no t yet approved or cleared by the https://www.young.biz/ FDA and  has been authorized for detection and/or diagnosis of SARS-CoV-2 by FDA under an Emergency Use Authorization (EUA). This EUA will remain  in effect (meaning this test can be used) for the duration of the COVID-19 declaration under Section 564(b)(1) of the Act, 21 U.S.C. section 360bbb-3(b)(1), unless the authorization is terminated or revoked sooner.     Influenza A by PCR NEGATIVE NEGATIVE Final   Influenza B by PCR NEGATIVE NEGATIVE Final    Comment: (NOTE) The Xpert Xpress SARS-CoV-2/FLU/RSV assay is intended as an aid in  the diagnosis of influenza from Nasopharyngeal swab specimens and  should not be used as a sole basis for treatment. Nasal washings and  aspirates are unacceptable for Xpert Xpress SARS-CoV-2/FLU/RSV  testing.  Fact Sheet for Patients: [place]  Fact Sheet for Healthcare Providers: https://www.moore.com/  This test is not yet approved or cleared by the https://www.young.biz/ FDA and  has been authorized for detection and/or diagnosis of SARS-CoV-2 by  FDA under an Emergency Use Authorization (EUA). This EUA will remain  in effect (meaning this test can be used) for the duration of the  Covid-19 declaration under Section 564(b)(1) of the Act, 21  U.S.C. section 360bbb-3(b)(1), unless the authorization is  terminated or revoked. Performed at [facility], [address] [place], [place]     Medical History: History reviewed. No pertinent past medical history.  Medications:  Scheduled:   enoxaparin (LOVENOX) injection  40 mg Subcutaneous Q24H   levETIRAcetam  500 mg Oral BID   Infusions:   sodium chloride     PRN: acetaminophen **OR** acetaminophen,  LORazepam, ondansetron **OR** ondansetron (ZOFRAN) IV  Assessment: Patient with possible seizure/TIA/stroke. Patient has been loaded with Keppra 2000 mg x 1 and has Keppra 500 mg BID ordered  Plan:  Current medications ordered do not appear to have interactions with antiepileptic therapy.   Caroline Miller A 09/29/2020,10:22 AM

## 2020-09-29 NOTE — ED Notes (Signed)
Attempted to call report x2

## 2020-09-29 NOTE — ED Notes (Signed)
Patient resting comfortably, respirations even and unlabored. NAD noted.   

## 2020-09-29 NOTE — H&P (Addendum)
History and Physical    Caroline Miller LPF:[id number] DOB: 12/30/1874 DOA: 09/29/2020  PCP: No primary care provider on file.   Patient coming from: Home  I have personally briefly reviewed patient's old medical records in [facility]  Chief Complaint: Altered mental status Most of the history was obtained from ER notes as patient is unable to provide any history  HPI: Caroline Miller is a 74 y.o. female with no significant medical history who was brought into the ER by EMS for evaluation of change in mental status.  Per EMS patient had gone to her neighbors apartment earlier this morning around 530 am and stated that she did not feel well.  She went back to her own apartment and her neighbor called EMS who found the patient sitting on the couch, patient was confused and disoriented with gaze deviation to the right.  Per EMS she was not able to speak in complete sentences and only intermittently follows commands.  She was nonverbal when she arrived to the ED. Patient had a possible seizure event while on the CT table and received IV Ativan. I am unable to do a review of systems on this patient to her mental status changes. Venous blood gas pH 7.34/51/31/27.5/54.9 Sodium 129, potassium 3.4, chloride 96, bicarb 20, glucose 113, BUN 9, creatinine 1.18, calcium 9.1, albumin 28, AST 28, ALT 17, total protein 6.9, white count 8.0, hemoglobin 12.2, hematocrit 34.2, MCV 84, RDW 12, platelet count 253 Respiratory viral panel is negative Urine toxicology screen positive for opioids Urine analysis sterile CT angiogram of the head and neck is negative for large vessel occlusion.  Atherosclerosis without flow-limiting stenosis of major vessels. Chest x-ray reviewed by me shows low volume chest with subtle right basilar opacity likely atelectasis Twelve-lead EKG shows atrial flutter   ED Course: Patient was brought into the emergency room by EMS for evaluation of change in mental status and had  what appeared to be a witnessed seizure in the emergency room.  She received IV Ativan and was loaded with IV Keppra.  She is unable to provide any history at this time.  She will be referred to observation status for further evaluation  Review of Systems: As per HPI otherwise 10 point review of systems negative.    History reviewed. No pertinent past medical history.  History reviewed. No pertinent surgical history.   has no history on file for tobacco use, alcohol use, and drug use.  Not on File  No family history on file.   Prior to Admission medications   Not on File    Physical Exam: Vitals:   09/29/20 0800 09/29/20 0830 09/29/20 0900 09/29/20 0930  BP: (!) 152/98 (!) 157/101 (!) 154/92 (!) 125/112  Pulse: 74 68 66 66  Resp: 14 15 18 13   SpO2: 97% 97% 100% 97%  Weight:      Height:         Vitals:   09/29/20 0800 09/29/20 0830 09/29/20 0900 09/29/20 0930  BP: (!) 152/98 (!) 157/101 (!) 154/92 (!) 125/112  Pulse: 74 68 66 66  Resp: 14 15 18 13   SpO2: 97% 97% 100% 97%  Weight:      Height:        Constitutional: Lethargic but arouses to verbal stimuli.  Oriented to person and place when awake Eyes: PERRL, lids and conjunctivae normal ENMT: Mucous membranes are moist.  Neck: normal, supple, no masses, no thyromegaly Respiratory: clear to auscultation bilaterally, no wheezing, no  crackles. Normal respiratory effort. No accessory muscle use.  Cardiovascular: Regular rate and rhythm, no murmurs / rubs / gallops. No extremity edema. 2+ pedal pulses. No carotid bruits.  Abdomen: no tenderness, no masses palpated. No hepatosplenomegaly. Bowel sounds positive.  Musculoskeletal: no clubbing / cyanosis. No joint deformity upper and lower extremities.  Skin: no rashes, lesions, ulcers.  Neurologic: No gross focal neurologic deficit.  Able to move all extremities Psychiatric: Normal mood and affect.   Labs on Admission: I have personally reviewed following labs and  imaging studies  CBC: Recent Labs  Lab 09/29/20 0635  WBC 8.0  NEUTROABS 5.0  HGB 12.2  HCT 34.2*  MCV 84.0  PLT 253   Basic Metabolic Panel: Recent Labs  Lab 09/29/20 0635  NA 129*  K 3.4*  CL 96*  CO2 20*  GLUCOSE 113*  BUN 9  CREATININE 1.18*  CALCIUM 9.1   GFR: Estimated Creatinine Clearance: -3 mL/min (A) (by C-G formula based on SCr of 1.18 mg/dL (H)). Liver Function Tests: Recent Labs  Lab 09/29/20 0635  AST 28  ALT 17  ALKPHOS 48  BILITOT 0.9  PROT 6.9  ALBUMIN 4.4   No results for input(s): LIPASE, AMYLASE in the last 168 hours. No results for input(s): AMMONIA in the last 168 hours. Coagulation Profile: Recent Labs  Lab 09/29/20 0635  INR 0.9   Cardiac Enzymes: No results for input(s): CKTOTAL, CKMB, CKMBINDEX, TROPONINI in the last 168 hours. BNP (last 3 results) No results for input(s): PROBNP in the last 8760 hours. HbA1C: No results for input(s): HGBA1C in the last 72 hours. CBG: Recent Labs  Lab 09/29/20 0637  GLUCAP 130*   Lipid Profile: No results for input(s): CHOL, HDL, LDLCALC, TRIG, CHOLHDL, LDLDIRECT in the last 72 hours. Thyroid Function Tests: No results for input(s): TSH, T4TOTAL, FREET4, T3FREE, THYROIDAB in the last 72 hours. Anemia Panel: No results for input(s): VITAMINB12, FOLATE, FERRITIN, TIBC, IRON, RETICCTPCT in the last 72 hours. Urine analysis:    Component Value Date/Time   COLORURINE STRAW (A) 09/29/2020 0720   APPEARANCEUR CLEAR (A) 09/29/2020 0720   LABSPEC 1.012 09/29/2020 0720   PHURINE 8.0 09/29/2020 0720   GLUCOSEU NEGATIVE 09/29/2020 0720   HGBUR NEGATIVE 09/29/2020 0720   BILIRUBINUR NEGATIVE 09/29/2020 0720   KETONESUR NEGATIVE 09/29/2020 0720   PROTEINUR NEGATIVE 09/29/2020 0720   NITRITE NEGATIVE 09/29/2020 0720   LEUKOCYTESUR TRACE (A) 09/29/2020 0720    Radiological Exams on Admission: CT Angio Head W or Wo Contrast  Result Date: 09/29/2020 CLINICAL DATA:  Neuro deficit EXAM: CT  ANGIOGRAPHY HEAD AND NECK TECHNIQUE: Multidetector CT imaging of the head and neck was performed using the standard protocol during bolus administration of intravenous contrast. Multiplanar CT image reconstructions and MIPs were obtained to evaluate the vascular anatomy. Carotid stenosis measurements (when applicable) are obtained utilizing NASCET criteria, using the distal internal carotid diameter as the denominator. CONTRAST:  54mL OMNIPAQUE IOHEXOL 350 MG/ML SOLN COMPARISON:  None. FINDINGS: CT HEAD FINDINGS Brain: No evidence of acute infarction, hemorrhage, hydrocephalus, extra-axial collection or mass lesion/mass effect. Vascular: No hyperdense vessel or unexpected calcification. 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 Hyperostosis interna. Sinuses: Clear Orbits: Gaze to the right. Review of the MIP images confirms the above findings CTA NECK FINDINGS Aortic arch: No acute finding. Four vessel branching with left vertebral artery arising from the arch Right carotid system: Mild atheromatous plaque at the ICA bulb. ICA tortuosity towards the skull base. No beading or ulceration.  Left carotid system: Mild atherosclerosis Vertebral arteries: No proximal subclavian stenosis on the right. The left vertebral artery arises from the arch. Calcified plaque at the left vertebral origin with streak artifact limiting lumen visualization, estimated at mild or moderate. Skeleton: Cervical spine degeneration with mild C3-4 anterolisthesis. Other neck: No acute finding. Upper chest: Negative Review of the MIP images confirms the above findings CTA HEAD FINDINGS Anterior circulation: Plaque along the carotid siphons. No branch occlusion, flow limiting stenosis, beading, or aneurysm. Posterior circulation: The vertebral and basilar arteries are smooth and widely patent. Hypoplastic right P1 segment with fetal type PCA flow. No branch occlusion, beading, or aneurysm. Venous sinuses: Patent Anatomic  variants: Early branching MCA on the left. Critical Value/emergent results were called by telephone at the time of interpretation on 09/29/2020 at 7:11 am to provider [facility] , who verbally acknowledged these results. Review of the MIP images confirms the above findings IMPRESSION: Head CT: No hemorrhage or visible infarct. CTA: 1. No emergent finding including large vessel occlusion. 2. Atherosclerosis without flow limiting stenosis of major vessels. Electronically Signed   By: Marnee Spring M.D.   On: 09/29/2020 07:16   CT Angio Neck W and/or Wo Contrast  Result Date: 09/29/2020 CLINICAL DATA:  Neuro deficit EXAM: CT ANGIOGRAPHY HEAD AND NECK TECHNIQUE: Multidetector CT imaging of the head and neck was performed using the standard protocol during bolus administration of intravenous contrast. Multiplanar CT image reconstructions and MIPs were obtained to evaluate the vascular anatomy. Carotid stenosis measurements (when applicable) are obtained utilizing NASCET criteria, using the distal internal carotid diameter as the denominator. CONTRAST:  51mL OMNIPAQUE IOHEXOL 350 MG/ML SOLN COMPARISON:  None. FINDINGS: CT HEAD FINDINGS Brain: No evidence of acute infarction, hemorrhage, hydrocephalus, extra-axial collection or mass lesion/mass effect. Vascular: No hyperdense vessel or unexpected calcification. 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 Hyperostosis interna. Sinuses: Clear Orbits: Gaze to the right. Review of the MIP images confirms the above findings CTA NECK FINDINGS Aortic arch: No acute finding. Four vessel branching with left vertebral artery arising from the arch Right carotid system: Mild atheromatous plaque at the ICA bulb. ICA tortuosity towards the skull base. No beading or ulceration. Left carotid system: Mild atherosclerosis Vertebral arteries: No proximal subclavian stenosis on the right. The left vertebral artery arises from the arch. Calcified plaque at the  left vertebral origin with streak artifact limiting lumen visualization, estimated at mild or moderate. Skeleton: Cervical spine degeneration with mild C3-4 anterolisthesis. Other neck: No acute finding. Upper chest: Negative Review of the MIP images confirms the above findings CTA HEAD FINDINGS Anterior circulation: Plaque along the carotid siphons. No branch occlusion, flow limiting stenosis, beading, or aneurysm. Posterior circulation: The vertebral and basilar arteries are smooth and widely patent. Hypoplastic right P1 segment with fetal type PCA flow. No branch occlusion, beading, or aneurysm. 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 Critical Value/emergent results were called by telephone at the time of interpretation on 09/29/2020 at 7:11 am to provider [facility] , who verbally acknowledged these results. Review of the MIP images confirms the above findings IMPRESSION: Head CT: No hemorrhage or visible infarct. CTA: 1. No emergent finding including large vessel occlusion. 2. Atherosclerosis without flow limiting stenosis of major vessels. Electronically Signed   By: Marnee Spring M.D.   On: 09/29/2020 07:16   DG Chest Portable 1 View  Result Date: 09/29/2020 CLINICAL DATA:  Confusion, weakness and altered mental status EXAM: PORTABLE CHEST 1  VIEW COMPARISON:  February 19, 2020 FINDINGS: Trachea midline. Cardiomediastinal contours are normal accounting for low lung volumes seen on today's exam. Subtle RIGHT lung base opacity above the RIGHT hemidiaphragm. No sign of effusion. On limited assessment skeletal structures without acute process. IMPRESSION: Low volume chest with subtle RIGHT basilar opacity likely atelectasis. Electronically Signed   By: Donzetta KohutGeoffrey  Wile M.D.   On: 09/29/2020 07:54    EKG: Independently reviewed.  Atrial flutter (no prior EKGs to compare with)  Assessment/Plan Principal Problem:   Seizure (HCC) Active Problems:   Hyponatremia    Hypokalemia   Atrial flutter by electrocardiogram [facility](HCC)     Seizure ??  New onset EMS found patient confused and disoriented with gaze deviation to the right and patient had a witnessed episode while at CT and received a dose of Ativan She was loaded with IV Keppra. Patient was seen in the emergency room remains confused but apparently improved from admission. Will obtain MRI of the brain without contrast to rule out an acute stroke We will consult neurology Place patient on seizure precautions     Hyponatremia Unclear etiology ??  Medication induced Gentle IV fluid hydration with saline   Hypokalemia Supplement potassium   Atrial flutter Noted on EKG No prior EKGs to compare with Patient is still a Erskine SquibbJane Miller and until her identity is known we will not be able to review prior records   DVT prophylaxis: Lovenox Code Status: Full code Family Communication:  Disposition Plan: Back to previous home environment Consults called: Neurology     Lucile Shuttersochukwu Daysi Boggan MD [facility]     09/29/2020, 10:28 AM

## 2020-09-29 NOTE — ED Notes (Signed)
While this RN was getting report from Meagan, RN, pt noted to be sitting at the end of the bed, pt states that she needs to urinate. Pt began urinating on the floor. Pt was moved to stretcher with bed alarm and was cleaned. Pure wick placed on patient. Pt is currently resting in bed. Charge RN called and informed of need for Recruitment consultant. Faith now at bedside sitting with pt

## 2020-09-29 NOTE — ED Notes (Signed)
Pt to MRI

## 2020-09-29 NOTE — ED Notes (Signed)
Sitter at bedside sue to pt trying to get out of bed

## 2020-09-29 NOTE — Progress Notes (Signed)
*  PRELIMINARY RESULTS* Echocardiogram 2D Echocardiogram has been performed.  Joanette Gula Noelani Harbach 09/29/2020, 11:53 AM

## 2020-09-29 NOTE — ED Triage Notes (Signed)
Pt from home via [facility]. Per EMS neighbor called after pt walked upstairs and told her that she doesn't feel good then walked back down to her house. EMS found her lying on the couch with a fixed gaze, not making sense, or able to complete sentences. Pt only able to follow some commands.

## 2020-09-29 NOTE — Consult Note (Signed)
Neurology Consultation Reason for Consult: Seizure Referring Physician: Agbata, T  CC: Seizure  History is obtained from: Patient, chart review  HPI: Caroline Miller is a 75 y.o. female with no history of seizures per the patient who his been having some trouble with her speech since she woke up yesterday morning (per patient).  She is not sure exactly what happened to bring her in.  She still has some difficulty with understanding and with talking, so history is fairly limited.   LKW: 9/29 prior to bed tpa given?: no, outside of window    ROS: A 14 point ROS was performed and is negative except as noted in the HPI.   Past medical history:  Hypercholesterolemia   Family history: No history of seizures  Social History: She denies alcohol or drug abuse  Exam: Current vital signs: BP (!) 125/112   Pulse 66   Resp 13   Ht 5\' 6"  (1.676 m)   Wt 99.6 kg   SpO2 97%   BMI 35.44 kg/m  Vital signs in last 24 hours: Pulse Rate:  [66-74] 66 (10/01 0930) Resp:  [13-18] 13 (10/01 0930) BP: (125-164)/(92-112) 125/112 (10/01 0930) SpO2:  [97 %-100 %] 97 % (10/01 0930) Weight:  [99.6 kg] 99.6 kg (10/01 0701)   Physical Exam  Constitutional: Appears well-developed and well-nourished.  Psych: Affect appropriate to situation Eyes: No scleral injection HENT: No OP obstrucion MSK: no joint deformities.  Cardiovascular: Normal rate and regular rhythm.  Respiratory: Effort normal, non-labored breathing GI: Soft.  No distension. There is no tenderness.  Skin: WDI  Neuro: Mental Status: Patient is awake, alert, oriented to person, place, month, year, and situation. Patient is able to give a clear and coherent history. She has what appears to be a significant expressive aphasia, with some perseveration, but she is able to follow commands Cranial Nerves: II: Visual Fields are full. Pupils are equal, round, and reactive to light.   III,IV, VI: EOMI without ptosis or diploplia.  V:  Facial sensation is symmetric to temperature VII: Facial movement with mild right facial weakness VIII: hearing is intact to voice X: Uvula elevates symmetrically XI: Shoulder shrug is symmetric. XII: tongue is midline without atrophy or fasciculations.  Motor: Tone is normal. Bulk is normal. 5/5 strength was present in all four extremities.  Sensory: Sensation is symmetric to light touch and temperature in the arms and legs. Cerebellar: No clear ataxia     I have reviewed labs in epic and the results pertinent to this consultation are: Sodium of 129  I have reviewed the images obtained: CTA head and neck-negative  Impression: 74 year old female with what sounds like a new onset seizure.  She will need to be further evaluated with EEG and MRI.  Etiology is currently unclear, but with her improving exam and lack of any type of infectious signs, low suspicion for infection.    Recommendations: 1) EEG 2) MRI brain 3) continue Keppra 500 twice daily for the time being 4) neurology will continue to follow   66, MD [facility] [phone]  If 7pm- 7am, please page neurology on call as listed in AMION.

## 2020-09-29 NOTE — Procedures (Signed)
Went to ED to perform eeg.  Pt was not in room.

## 2020-09-29 NOTE — Progress Notes (Addendum)
Requested by Marval Regal [phone] to call by MD if they they miss each other in pt room. Notify incoming shift.   Update 2308: pt has a scheduled Keppra PO was pt diet was NPO.Pt has two fluids ordered one which is 0.45 % NACL with KCI (one that is running when pt came to the floor)and 0.9 % sodium chloride ( one that was not running). Both fluids are ordered to run at 75 ml/hrNotify NP Morrsion. Will continue to monitor.  Update 2344: NP Jon Billings ordered clear liquid diet for pt and discontinue the 0.45 % NACL with 20 MEQ KCI. 0.9 % sodium chloride was started. Will continue to monitor.

## 2020-09-29 NOTE — ED Notes (Signed)
Pt emergency contact Lucan [phone]

## 2020-09-29 NOTE — ED Notes (Signed)
Pt to Xray.

## 2020-09-29 NOTE — ED Notes (Signed)
Attempted to call report x 3. 

## 2020-09-29 NOTE — ED Notes (Signed)
Pt spoke with son on personal cell phone.   Caroline Miller - [phone]

## 2020-09-29 NOTE — ED Provider Notes (Signed)
Assumed care from Dr. Larinda Buttery at 7 AM. Briefly, the patient is a 74 y.o. female with PMHx of  has no past medical history on file. here with AMS. Reportedly told neighbor she didn't feel well this AM, arrived with rightward gaze deviation. Had witnessed GTC seizure but starting to speak a little after Ativan.  Labs Reviewed  GLUCOSE, CAPILLARY - Abnormal; Notable for the following components:      Result Value   Glucose-Capillary 130 (*)    All other components within normal limits  RESPIRATORY PANEL BY RT PCR (FLU A&B, COVID)  PROTIME-INR  APTT  CBC  DIFFERENTIAL  COMPREHENSIVE METABOLIC PANEL  URINE DRUG SCREEN, QUALITATIVE ([facility] ONLY)  URINALYSIS, ROUTINE W REFLEX MICROSCOPIC   EKG: Normal sinus rhythm, VR 70. QRS 116, QTc 486. Nonspecific IVC, minimal ST elevation lateral leads, no acute St changes.  Course of Care: -CT Angio shows no large vessel occlusion or strokes. No bleeds. Pt increasingly awake after Ativan, MAE, answering some questions but remains confused. Will continue seizure tx, plan to admit. -COVID neg. UDS+ opiates but otherwise unremarkable. VBG without retention. EtOH negative. Pt increasingly alert after Ativan. Concern for new onset seizures. No signs of LVO on CT. Unclear if she has had these before as pt remains confused, will admit.     Shaune Pollack, MD 09/29/20 [phone]

## 2020-09-29 NOTE — ED Provider Notes (Signed)
[facility] Emergency Department Provider Note   ____________________________________________   First MD Initiated Contact with Patient 09/29/20 [phone]     (approximate)  I have reviewed the triage vital signs and the nursing notes.   HISTORY  Chief Complaint Altered Mental Status    HPI Caroline Miller is a 74 y.o. female who presents to the ED for altered mental status.  History is limited due to patient's altered mental status.  Per EMS, patient had gone to her neighbor's apartment earlier this morning around 530 and stated that she did not feel well.  She then went back to her own apartment and her neighbor called EMS.  EMS found her sitting on the couch acting confused and disoriented with gaze deviation to the right.  Patient was not able to speak in complete sentences and only intermittently followed commands.  Patient is nonverbal upon arrival to the ED.        History reviewed. No pertinent past medical history.  There are no problems to display for this patient.   History reviewed. No pertinent surgical history.  Prior to Admission medications   Not on File    Allergies Patient has no allergy information on record.  No family history on file.  Social History Social History   Tobacco Use  . Smoking status: Not on file  Substance Use Topics  . Alcohol use: Not on file  . Drug use: Not on file    Review of Systems Unable to obtain secondary to altered mental status  ____________________________________________   PHYSICAL EXAM:  VITAL SIGNS: ED Triage Vitals  Enc Vitals Group     BP      Pulse      Resp      Temp      Temp src      SpO2      Weight      Height      Head Circumference      Peak Flow      Pain Score      Pain Loc      Pain Edu?      Excl. in GC?     Constitutional: Awake and alert, nonverbal Eyes: Conjunctivae are normal.  Gaze deviation to the right, pupils equal round and reactive to light  bilaterally. Head: Atraumatic. Nose: No congestion/rhinnorhea. Mouth/Throat: Mucous membranes are moist. Neck: Normal ROM Cardiovascular: Normal rate, regular rhythm. Grossly normal heart sounds. Respiratory: Normal respiratory effort.  No retractions. Lungs CTAB. Gastrointestinal: Soft and nontender. No distention. Genitourinary: deferred Musculoskeletal: No lower extremity tenderness nor edema. Neurologic: Nonverbal, withdraws from pain in all 4 extremities. Skin:  Skin is warm, dry and intact. No rash noted. Psychiatric: Mood and affect are normal. Speech and behavior are normal.  ____________________________________________   LABS (all labs ordered are listed, but only abnormal results are displayed)  Labs Reviewed  GLUCOSE, CAPILLARY - Abnormal; Notable for the following components:      Result Value   Glucose-Capillary 130 (*)    All other components within normal limits  RESPIRATORY PANEL BY RT PCR (FLU A&B, COVID)  PROTIME-INR  APTT  CBC  DIFFERENTIAL  COMPREHENSIVE METABOLIC PANEL  URINE DRUG SCREEN, QUALITATIVE ([facility] ONLY)  URINALYSIS, ROUTINE W REFLEX MICROSCOPIC  ETHANOL  BLOOD GAS, VENOUS   ____________________________________________  EKG  ED ECG REPORT I, Chesley Noon, the attending physician, personally viewed and interpreted this ECG.   Date: 09/29/2020  EKG Time: 6:32  Rate: 70  Rhythm: normal sinus rhythm  Axis: Normal  Intervals:none  ST&T Change: None   PROCEDURES  Procedure(s) performed (including Critical Care):  .Critical Care Performed by: Chesley Noon, MD Authorized by: Chesley Noon, MD   Critical care provider statement:    Critical care time (minutes):  45   Critical care time was exclusive of:  Separately billable procedures and treating other patients and teaching time   Critical care was necessary to treat or prevent imminent or life-threatening deterioration of the following conditions:  CNS failure or compromise    Critical care was time spent personally by me on the following activities:  Discussions with consultants, evaluation of patient's response to treatment, examination of patient, ordering and performing treatments and interventions, ordering and review of laboratory studies, ordering and review of radiographic studies, pulse oximetry, re-evaluation of patient's condition, obtaining history from patient or surrogate and review of old charts   I assumed direction of critical care for this patient from another provider in my specialty: no       ____________________________________________   INITIAL IMPRESSION / ASSESSMENT AND PLAN / ED COURSE       Unknown age female presents to the ED for altered mental status after she initially told her neighbor that she did not feel well about an hour prior to arrival, subsequently found by EMS slumped on her couch with gaze deviation to the right.  She continues to have gaze deviation to the right along with reduced response to pain in all 4 extremities.  She is nonverbal and does not seem to be following commands.  Given her gaze deviation, code stroke was called and we will assess with CT head as well as CTA head and neck to rule out large vessel occlusion.  Patient unfortunately had a generalized seizure episode while in the CT scanner.  This episode lasted approximately 3 minutes and resolved following treatment with 2 mg of Ativan.  CT head is negative for acute process and radiology stated verbally that there does not seem to be any signs of large vessel occlusion on CTA.  Patient is now increasingly awake and alert, attempting to get up out of bed and stating that she needs to go to the bathroom.  She does not seem to have any focal neurologic deficits at this time, gaze is no longer deviated to the right.  It is possible that she initially presented with partial seizure that subsequently generalized.  Patient evaluated by teleneurologist, we will load patient  with IV Keppra, anticipate admission following lab results.  Patient turned over to oncoming provider pending additional results.      ____________________________________________   FINAL CLINICAL IMPRESSION(S) / ED DIAGNOSES  Final diagnoses:  Altered mental status, unspecified altered mental status type  Seizure [facility])     ED Discharge Orders    None       Note:  This document was prepared using Dragon voice recognition software and may include unintentional dictation errors.   Chesley Noon, MD 09/29/20 0730

## 2020-09-29 NOTE — Procedures (Signed)
Patient Name: Caroline Miller  MRN: [id number]  Epilepsy Attending: Charlsie Quest  Referring Physician/Provider: Dr Ritta Slot Date: 09/29/2020 Duration: 21.51 mins  Patient history: 74 y.o. female with no history of seizures per the patient who his been having some trouble with her speech since she woke up yesterday morning (per patient). EEG to evaluate for seizure.  Level of alertness: Awake, asleep  AEDs during EEG study: LEV  Technical aspects: This EEG study was done with scalp electrodes positioned according to the 10-20 International system of electrode placement. Electrical activity was acquired at a sampling rate of 500Hz  and reviewed with a high frequency filter of 70Hz  and a low frequency filter of 1Hz . EEG data were recorded continuously and digitally stored.   Description: No posterior dominant rhythm was seen. Sleep was characterized by sleep spindles (12 to 14 Hz), maximal frontocentral region. EEG showed continuous generalized 3 to 6 Hz theta-delta slowing. Physiologic photic driving was not seen during photic stimulation.  Hyperventilation was not performed.     ABNORMALITY -Continuous slow, generalized  IMPRESSION: This study is suggestive of moderate diffuse encephalopathy, nonspecific etiology. No seizures or epileptiform discharges were seen throughout the recording.  Caroline Miller 

## 2020-09-29 NOTE — Consult Note (Signed)
[facility] TeleNeurology Consult Services   Date of Service:   09/29/2020 07:05:18  Impression:     .  G45.9 - Transient cerebral ischemic attack, unspecified     .  R56.9 - Seizures  Comments/Sign-Out: Pt is a unknown female who presented to ED with AMS and possible seizure event on CT table. NIHSS: 5. CT/CTA were negative for any acute process. Admit for TIA vs SEIZURE vs TOXIC/METABOLIC/INFECTIOUS workup.  Metrics: Last Known Well: Unknown [facility] Notification Time: 09/29/2020 07:05:18 Arrival Time: 09/29/2020 06:28:00 Stamp Time: 09/29/2020 07:05:18 Time First Login Attempt: 09/29/2020 07:05:57 Symptoms: AMS. NIHSS Start Assessment Time: 09/29/2020 07:09:40 Patient is not a candidate for Thrombolytic. Thrombolytic Medical Decision: 09/29/2020 07:18:39 Patient was not deemed candidate for Thrombolytic because of following reasons: LKN is not clear, possible seizure.  CT head showed no acute hemorrhage or acute core infarct.  Radiologist was not called back for review of advanced imaging CTA: negative for LVO.  CTA: No emergent finding including large vessel occlusion. Atherosclerosis without flow limiting stenosis of major vessels.  ED Physician notified of diagnostic impression and management plan on 09/29/2020 07:29:51  Advanced Imaging: CTA Head and Neck Completed. LVO:No   Our recommendations are outlined below.  Recommendations:     .  Activate Stroke Protocol Admission/Order Set     .  Stroke/Telemetry Floor     .  Neuro Checks     .  Bedside Swallow Eval     .  DVT Prophylaxis     .  IV Fluids, Normal Saline     .  Head of Bed 30 Degrees     .  Euglycemia and Avoid Hyperthermia (PRN Acetaminophen)     .  Monitor neuro checks/VS q4h with telemetry.     .  Recommend fall precautions.     .  Goal SBP b/w 140-160 today and afterwards 100-140.     Marland Kitchen  Start ASA + STATIN if no contraindications.      .  Get MRI BRAIN W/ and W/O, EEG, and ECHO.      Marland Kitchen  Get COVID, UDS, ETOH, ESR/CRP, TROP, CK, TSH, B12, BNP, LACTIC ACID, LIPID PANEL, and A1C.     .  Get WORKUP for TOXIC/METABOLIC/INFECTIOUS causes. *Load with LEV 1000 mg IV now. -Give Lorazepam 2 mg IV PRN for seizures > 3 min or > 3 episodes in one hour. Don't give more than 6-8 mg total in 24 hour period). -Please avoid aminophylline, theophylline, isoniazid, lindane, metronidazole, nalidixic acid, meropenem/imipenem, cefepime, TCAs, bupropion, cyclosporine, chlorambucil, tramadol, clozapine, or other drugs which can lower seizure threshold.  Routine Consultation with [facility] for Follow up Care  Sign Out:     .  Discussed with Emergency Department Provider    ------------------------------------------------------------------------------  History of Present Illness: Patient is a 74 year old Female.  Patient was brought by EMS for symptoms of AMS.  Pt is a unknown female who presented to ED with AMS. She was not feeling well and went to a neighbors house and EMS was called around [place]. Her LKN is not clear. She was on lisinopril and meclizine. She got ativan 2 mg at 0653 in CT due to seizure like event. She had soiled her pants, no tongue biting. She was altered and moving all extremities and confused, and took two nursed to hold her down on stretcher, no obvious facial droop and she was able to string together sentence to state she need to urinate.   Past Medical History:     .  Hypertension     . There is NO history of Diabetes Mellitus     . There is NO history of Hyperlipidemia     . There is NO history of Atrial Fibrillation     . There is NO history of Coronary Artery Disease     . There is NO history of Stroke  Social History:Unable to obtain due to Patient Status  Family History:Unable to assess Unable to assess  Anticoagulant use:  No  Antiplatelet use: No  NIHSS may not be reliable due to: AMS, and pt not cooperative.  Examination: BP(164/100),  Pulse(70), Blood Glucose(130) 1A: Level of Consciousness - Alert; keenly responsive + 0 1B: Ask Month and Age - Could Not Answer Either Question Correctly + 2 1C: Blink Eyes & Squeeze Hands - Performs 0 Tasks + 2 2: Test Horizontal Extraocular Movements - Normal + 0 3: Test Visual Fields - No Visual Loss + 0 4: Test Facial Palsy (Use Grimace if Obtunded) - Normal symmetry + 0 5A: Test Left Arm Motor Drift - No Drift for 10 Seconds + 0 5B: Test Right Arm Motor Drift - No Drift for 10 Seconds + 0 6A: Test Left Leg Motor Drift - No Drift for 5 Seconds + 0 6B: Test Right Leg Motor Drift - No Drift for 5 Seconds + 0 7: Test Limb Ataxia (FNF/Heel-Shin) - Does Not Understand + 0 8: Test Sensation - Normal; No sensory loss + 0 9: Test Language/Aphasia - Normal; No aphasia + 0 10: Test Dysarthria - Mild-Moderate Dysarthria: Slurring but can be understood + 1 11: Test Extinction/Inattention - No abnormality + 0  NIHSS Score: 5  Pre-Morbid Modified Rankin Scale: Unable to assess   Patient/Family was informed the Neurology Consult would occur via TeleHealth consult by way of interactive audio and video telecommunications and consented to receiving care in this manner.   Patient is being evaluated for possible acute neurologic impairment and high probability of imminent or life-threatening deterioration. I spent total of 30 minutes providing care to this patient, including time for face to face visit via telemedicine, review of medical records, imaging studies and discussion of findings with providers, the patient and/or family.   Dr Currie Paris   [facility] [phone]  Case [id number]

## 2020-09-30 DIAGNOSIS — G894 Chronic pain syndrome: Secondary | ICD-10-CM | POA: Diagnosis present

## 2020-09-30 DIAGNOSIS — R569 Unspecified convulsions: Secondary | ICD-10-CM | POA: Diagnosis present

## 2020-09-30 DIAGNOSIS — I10 Essential (primary) hypertension: Secondary | ICD-10-CM | POA: Diagnosis present

## 2020-09-30 DIAGNOSIS — E785 Hyperlipidemia, unspecified: Secondary | ICD-10-CM | POA: Diagnosis present

## 2020-09-30 DIAGNOSIS — Z79891 Long term (current) use of opiate analgesic: Secondary | ICD-10-CM | POA: Diagnosis not present

## 2020-09-30 DIAGNOSIS — N179 Acute kidney failure, unspecified: Secondary | ICD-10-CM | POA: Diagnosis present

## 2020-09-30 DIAGNOSIS — Z6837 Body mass index (BMI) 37.0-37.9, adult: Secondary | ICD-10-CM | POA: Diagnosis not present

## 2020-09-30 DIAGNOSIS — I214 Non-ST elevation (NSTEMI) myocardial infarction: Secondary | ICD-10-CM | POA: Diagnosis present

## 2020-09-30 DIAGNOSIS — I5181 Takotsubo syndrome: Principal | ICD-10-CM

## 2020-09-30 DIAGNOSIS — G9340 Encephalopathy, unspecified: Secondary | ICD-10-CM | POA: Diagnosis not present

## 2020-09-30 DIAGNOSIS — Z20822 Contact with and (suspected) exposure to covid-19: Secondary | ICD-10-CM | POA: Diagnosis present

## 2020-09-30 DIAGNOSIS — F32A Depression, unspecified: Secondary | ICD-10-CM | POA: Diagnosis present

## 2020-09-30 DIAGNOSIS — E871 Hypo-osmolality and hyponatremia: Secondary | ICD-10-CM | POA: Diagnosis present

## 2020-09-30 DIAGNOSIS — G459 Transient cerebral ischemic attack, unspecified: Secondary | ICD-10-CM | POA: Diagnosis present

## 2020-09-30 DIAGNOSIS — E7800 Pure hypercholesterolemia, unspecified: Secondary | ICD-10-CM | POA: Diagnosis present

## 2020-09-30 DIAGNOSIS — I4892 Unspecified atrial flutter: Secondary | ICD-10-CM | POA: Diagnosis present

## 2020-09-30 DIAGNOSIS — E876 Hypokalemia: Secondary | ICD-10-CM | POA: Diagnosis present

## 2020-09-30 LAB — LIPID PANEL
Cholesterol: 242 mg/dL — ABNORMAL HIGH (ref 0–200)
HDL: 59 mg/dL (ref >40)
LDL Cholesterol: 166 mg/dL — ABNORMAL HIGH (ref 0–99)
Total CHOL/HDL Ratio: 4.1 RATIO
Triglycerides: 84 mg/dL (ref <150)
VLDL: 17 mg/dL (ref 0–40)

## 2020-09-30 LAB — COMPREHENSIVE METABOLIC PANEL
ALT: 18 U/L (ref 0–44)
AST: 47 U/L — ABNORMAL HIGH (ref 15–41)
Albumin: 4.2 g/dL (ref 3.5–5.0)
Alkaline Phosphatase: 55 U/L (ref 38–126)
Anion gap: 9 (ref 5–15)
BUN: 7 mg/dL — ABNORMAL LOW (ref 8–23)
CO2: 24 mmol/L (ref 22–32)
Calcium: 9.5 mg/dL (ref 8.9–10.3)
Chloride: 100 mmol/L (ref 98–111)
Creatinine, Ser: 0.95 mg/dL (ref 0.44–1.00)
GFR calc Af Amer: >60 mL/min (ref >60)
GFR calc non Af Amer: 59 mL/min — ABNORMAL LOW (ref >60)
Glucose, Bld: 102 mg/dL — ABNORMAL HIGH (ref 70–99)
Potassium: 4.5 mmol/L (ref 3.5–5.1)
Sodium: 133 mmol/L — ABNORMAL LOW (ref 135–145)
Total Bilirubin: 0.9 mg/dL (ref 0.3–1.2)
Total Protein: 6.6 g/dL (ref 6.5–8.1)

## 2020-09-30 LAB — TROPONIN I (HIGH SENSITIVITY)
Troponin I (High Sensitivity): 7421 ng/L (ref <18)
Troponin I (High Sensitivity): 6485 ng/L (ref <18)

## 2020-09-30 LAB — HEMOGLOBIN A1C
Hgb A1c MFr Bld: 5.6 % (ref 4.8–5.6)
Mean Plasma Glucose: 114.02 mg/dL

## 2020-09-30 LAB — MAGNESIUM: Magnesium: 1.4 mg/dL — ABNORMAL LOW (ref 1.7–2.4)

## 2020-09-30 MED ORDER — SACUBITRIL-VALSARTAN 49-51 MG PO TABS
1.0000 | ORAL_TABLET | Freq: Two times a day (BID) | ORAL | Status: DC
  Administered 2020-09-30 – 2020-10-03 (×7): 1 via ORAL
  Filled 2020-09-30 (×7): qty 1

## 2020-09-30 MED ORDER — MAGNESIUM SULFATE 4 GM/100ML IV SOLN
4.0000 g | Freq: Once | INTRAVENOUS | Status: AC
  Administered 2020-09-30: 4 g via INTRAVENOUS
  Filled 2020-09-30: qty 100

## 2020-09-30 MED ORDER — ASPIRIN 81 MG PO CHEW
81.0000 mg | CHEWABLE_TABLET | Freq: Every day | ORAL | Status: DC
  Administered 2020-09-30 – 2020-10-03 (×4): 81 mg via ORAL
  Filled 2020-09-30 (×3): qty 1

## 2020-09-30 MED ORDER — LISINOPRIL 20 MG PO TABS: 40.0000 mg | ORAL_TABLET | Freq: Every day | ORAL | Status: DC

## 2020-09-30 MED ORDER — HYDROCODONE-ACETAMINOPHEN 10-325 MG PO TABS
1.0000 | ORAL_TABLET | Freq: Three times a day (TID) | ORAL | Status: DC | PRN
  Administered 2020-09-30 – 2020-10-03 (×8): 1 via ORAL
  Filled 2020-09-30 (×8): qty 1

## 2020-09-30 MED ORDER — MECLIZINE HCL 25 MG PO TABS
25.0000 mg | ORAL_TABLET | Freq: Four times a day (QID) | ORAL | Status: DC | PRN
  Filled 2020-09-30: qty 1

## 2020-09-30 MED ORDER — EZETIMIBE 10 MG PO TABS
10.0000 mg | ORAL_TABLET | Freq: Every day | ORAL | Status: DC
  Administered 2020-09-30 – 2020-10-03 (×4): 10 mg via ORAL
  Filled 2020-09-30 (×4): qty 1

## 2020-09-30 MED ORDER — CYCLOBENZAPRINE HCL 10 MG PO TABS: 5.0000 mg | ORAL_TABLET | Freq: Three times a day (TID) | ORAL | Status: DC | PRN

## 2020-09-30 MED ORDER — DULOXETINE HCL 30 MG PO CPEP
30.0000 mg | ORAL_CAPSULE | Freq: Every day | ORAL | Status: DC
  Administered 2020-09-30 – 2020-10-03 (×4): 30 mg via ORAL
  Filled 2020-09-30 (×4): qty 1

## 2020-09-30 MED ORDER — TRAZODONE HCL 50 MG PO TABS
150.0000 mg | ORAL_TABLET | Freq: Every day | ORAL | Status: DC
  Administered 2020-09-30 – 2020-10-02 (×3): 150 mg via ORAL
  Filled 2020-09-30 (×3): qty 1

## 2020-09-30 MED ORDER — HEPARIN (PORCINE) 25000 UT/250ML-% IV SOLN
1250.0000 [IU]/h | INTRAVENOUS | Status: DC
  Administered 2020-09-30: 900 [IU]/h via INTRAVENOUS
  Administered 2020-10-01: 1250 [IU]/h via INTRAVENOUS
  Filled 2020-09-30 (×2): qty 250

## 2020-09-30 MED ORDER — PANTOPRAZOLE SODIUM 40 MG PO TBEC
40.0000 mg | DELAYED_RELEASE_TABLET | Freq: Every day | ORAL | Status: DC
  Administered 2020-09-30 – 2020-10-03 (×4): 40 mg via ORAL
  Filled 2020-09-30 (×4): qty 1

## 2020-09-30 MED ORDER — HYDROXYZINE HCL 25 MG PO TABS: 25.0000 mg | ORAL_TABLET | Freq: Three times a day (TID) | ORAL | Status: DC | PRN

## 2020-09-30 MED ORDER — BUSPIRONE HCL 5 MG PO TABS
5.0000 mg | ORAL_TABLET | Freq: Three times a day (TID) | ORAL | Status: DC | PRN
  Filled 2020-09-30: qty 1

## 2020-09-30 MED ORDER — HEPARIN BOLUS VIA INFUSION
4000.0000 [IU] | Freq: Once | INTRAVENOUS | Status: AC
  Administered 2020-09-30: 4000 [IU] via INTRAVENOUS
  Filled 2020-09-30: qty 4000

## 2020-09-30 MED ORDER — CARVEDILOL 3.125 MG PO TABS
3.1250 mg | ORAL_TABLET | Freq: Two times a day (BID) | ORAL | Status: DC
  Administered 2020-09-30 – 2020-10-03 (×6): 3.125 mg via ORAL
  Filled 2020-09-30 (×6): qty 1

## 2020-09-30 NOTE — Plan of Care (Signed)
  Problem: Education: Goal: Knowledge of General Education information will improve Description: Including pain rating scale, medication(s)/side effects and non-pharmacologic comfort measures Outcome: Progressing   Problem: Safety: Goal: Ability to remain free from injury will improve Outcome: Progressing   

## 2020-09-30 NOTE — Consult Note (Signed)
CARDIOLOGY CONSULT NOTE               Patient ID: Ece Cumberland MRN: [id number] DOB/AGE: 06-07-1946 74 y.o.  Admit date: 09/29/2020 Referring Physician Dr. Joylene Igo hospitalist Primary Physician [facility] Primary Cardiologist [facility] Reason for Consultation cardiomyopathy elevated troponins  HPI: Patient is a 74 year old female mild obesity had what was thought to be a witnessed seizure placed on Keppra denies any previous cardiac history EKG was borderline abnormal thought to be atrial flutter but appears to be sinus rhythm to me denies any chest pain or shortness of breath but had echocardiogram which showed significant cardiomyopathy almost Takotsubo.  Subsequently troponin came back at 7000 so cardiology consultation was recommended.  With any previous cardiac history and now with cardiomyopathy and elevated troponin further work-up may be necessary short-term anticoagulation and subsequently further cardiac work-up  Review of systems complete and found to be negative unless listed above     History reviewed. No pertinent past medical history.  History reviewed. No pertinent surgical history.  Medications Prior to Admission  Medication Sig Dispense Refill Last Dose  . busPIRone (BUSPAR) 5 MG tablet Take 5 mg by mouth 3 (three) times daily as needed.   09/28/2020 at 2000  . cyclobenzaprine (FLEXERIL) 5 MG tablet Take 5 mg by mouth 3 (three) times daily as needed.   Past Week at prn  . DULoxetine (CYMBALTA) 30 MG capsule Take 30 mg by mouth daily.   09/28/2020 at 2000  . ezetimibe (ZETIA) 10 MG tablet Take 10 mg by mouth daily.   09/28/2020 at 2000  . hydrochlorothiazide (HYDRODIURIL) 25 MG tablet Take 25 mg by mouth every morning.   09/28/2020 at 2000  . HYDROcodone-acetaminophen (NORCO) 10-325 MG tablet Take 1 tablet by mouth 3 (three) times daily as needed. for pain   09/29/2020 at 0000  . hydrOXYzine (ATARAX/VISTARIL) 25 MG tablet Take 25 mg by mouth every 8 (eight) hours as needed.    09/28/2020 at 2000  . lisinopril (ZESTRIL) 40 MG tablet Take 40 mg by mouth daily.   09/28/2020 at 2000  . meloxicam (MOBIC) 7.5 MG tablet Take 7.5 mg by mouth 2 (two) times daily as needed.   09/28/2020 at 2000  . omeprazole (PRILOSEC) 40 MG capsule Take 40 mg by mouth daily.   09/28/2020 at 2000  . traZODone (DESYREL) 50 MG tablet Take 150 mg by mouth at bedtime.   09/28/2020 at 2000  . meclizine (ANTIVERT) 25 MG tablet Take 25 mg by mouth every 6 (six) hours as needed for dizziness.   unknown at prn  . ondansetron (ZOFRAN-ODT) 4 MG disintegrating tablet Take 4 mg by mouth every 8 (eight) hours as needed for nausea/vomiting.   unknown at prn   Social History   Socioeconomic History  . Marital status: Widowed    Spouse name: Not on file  . Number of children: Not on file  . Years of education: Not on file  . Highest education level: Not on file  Occupational History  . Not on file  Tobacco Use  . Smoking status: Not on file  Substance and Sexual Activity  . Alcohol use: Not on file  . Drug use: Not on file  . Sexual activity: Not on file  Other Topics Concern  . Not on file  Social History Narrative  . Not on file   Social Determinants of Health   Financial Resource Strain:   . Difficulty of Paying Living Expenses: Not on file  Food Insecurity:   .  Worried About Programme researcher, broadcasting/film/video in the Last Year: Not on file  . Ran Out of Food in the Last Year: Not on file  Transportation Needs:   . Lack of Transportation (Medical): Not on file  . Lack of Transportation (Non-Medical): Not on file  Physical Activity:   . Days of Exercise per Week: Not on file  . Minutes of Exercise per Session: Not on file  Stress:   . Feeling of Stress : Not on file  Social Connections:   . Frequency of Communication with Friends and Family: Not on file  . Frequency of Social Gatherings with Friends and Family: Not on file  . Attends Religious Services: Not on file  . Active Member of Clubs or  Organizations: Not on file  . Attends Banker Meetings: Not on file  . Marital Status: Not on file  Intimate Partner Violence:   . Fear of Current or Ex-Partner: Not on file  . Emotionally Abused: Not on file  . Physically Abused: Not on file  . Sexually Abused: Not on file    No family history on file.    Review of systems complete and found to be negative unless listed above      PHYSICAL EXAM  General: Well developed, well nourished, in no acute distress HEENT:  Normocephalic and atramatic Neck:  No JVD.  Lungs: Clear bilaterally to auscultation and percussion. Heart: HRRR . Normal S1 and S2 without gallops or murmurs.  Abdomen: Bowel sounds are positive, abdomen soft and non-tender  Msk:  Back normal, normal gait. Normal strength and tone for age. Extremities: No clubbing, cyanosis or edema.   Neuro: Alert and oriented X 3. Psych:  Good affect, responds appropriately  Labs:   Lab Results  Component Value Date   WBC 8.0 09/29/2020   HGB 12.2 09/29/2020   HCT 34.2 (L) 09/29/2020   MCV 84.0 09/29/2020   PLT 253 09/29/2020    Recent Labs  Lab 09/30/20 0502  NA 133*  K 4.5  CL 100  CO2 24  BUN 7*  CREATININE 0.95  CALCIUM 9.5  PROT 6.6  BILITOT 0.9  ALKPHOS 55  ALT 18  AST 47*  GLUCOSE 102*   No results found for: CKTOTAL, CKMB, CKMBINDEX, TROPONINI No results found for: CHOL No results found for: HDL No results found for: LDLCALC No results found for: TRIG No results found for: CHOLHDL No results found for: LDLDIRECT    Radiology: EEG  Result Date: 09/29/2020 Charlsie Quest, MD     09/29/2020  5:28 PM Patient Name: Melvie Paglia MRN: [id number] Epilepsy Attending: Charlsie Quest Referring Physician/Provider: Dr Ritta Slot Date: 09/29/2020 Duration: 21.51 mins Patient history: 74 y.o. female with no history of seizures per the patient who his been having some trouble with her speech since she woke up yesterday morning (per  patient). EEG to evaluate for seizure. Level of alertness: Awake, asleep AEDs during EEG study: LEV Technical aspects: This EEG study was done with scalp electrodes positioned according to the 10-20 International system of electrode placement. Electrical activity was acquired at a sampling rate of 500Hz  and reviewed with a high frequency filter of 70Hz  and a low frequency filter of 1Hz . EEG data were recorded continuously and digitally stored. Description: No posterior dominant rhythm was seen. Sleep was characterized by sleep spindles (12 to 14 Hz), maximal frontocentral region. EEG showed continuous generalized 3 to 6 Hz theta-delta slowing. Physiologic photic driving was not seen during  photic stimulation.  Hyperventilation was not performed.   ABNORMALITY -Continuous slow, generalized IMPRESSION: This study is suggestive of moderate diffuse encephalopathy, nonspecific etiology. No seizures or epileptiform discharges were seen throughout the recording. Charlsie Quest   CT Angio Head W or Wo Contrast  Result Date: 09/29/2020 CLINICAL DATA:  Neuro deficit EXAM: CT ANGIOGRAPHY HEAD AND NECK TECHNIQUE: Multidetector CT imaging of the head and neck was performed using the standard protocol during bolus administration of intravenous contrast. Multiplanar CT image reconstructions and MIPs were obtained to evaluate the vascular anatomy. Carotid stenosis measurements (when applicable) are obtained utilizing NASCET criteria, using the distal internal carotid diameter as the denominator. CONTRAST:  75mL OMNIPAQUE IOHEXOL 350 MG/ML SOLN COMPARISON:  None. FINDINGS: CT HEAD FINDINGS Brain: No evidence of acute infarction, hemorrhage, hydrocephalus, extra-axial collection or mass lesion/mass effect. Vascular: No hyperdense vessel or unexpected calcification. 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 Hyperostosis interna. Sinuses: Clear Orbits: Gaze to the right. Review of the MIP images confirms  the above findings CTA NECK FINDINGS Aortic arch: No acute finding. Four vessel branching with left vertebral artery arising from the arch Right carotid system: Mild atheromatous plaque at the ICA bulb. ICA tortuosity towards the skull base. No beading or ulceration. Left carotid system: Mild atherosclerosis Vertebral arteries: No proximal subclavian stenosis on the right. The left vertebral artery arises from the arch. Calcified plaque at the left vertebral origin with streak artifact limiting lumen visualization, estimated at mild or moderate. Skeleton: Cervical spine degeneration with mild C3-4 anterolisthesis. Other neck: No acute finding. Upper chest: Negative Review of the MIP images confirms the above findings CTA HEAD FINDINGS Anterior circulation: Plaque along the carotid siphons. No branch occlusion, flow limiting stenosis, beading, or aneurysm. Posterior circulation: The vertebral and basilar arteries are smooth and widely patent. Hypoplastic right P1 segment with fetal type PCA flow. No branch occlusion, beading, or aneurysm. 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 Critical Value/emergent results were called by telephone at the time of interpretation on 09/29/2020 at 7:11 am to provider [facility] , who verbally acknowledged these results. Review of the MIP images confirms the above findings IMPRESSION: Head CT: No hemorrhage or visible infarct. CTA: 1. No emergent finding including large vessel occlusion. 2. Atherosclerosis without flow limiting stenosis of major vessels. Electronically Signed   By: Marnee Spring M.D.   On: 09/29/2020 07:16   DG Abd 1 View  Result Date: 09/29/2020 CLINICAL DATA:  Evaluate for metallic foreign body prior to MRI EXAM: ABDOMEN - 1 VIEW COMPARISON:  None. FINDINGS: The bowel gas pattern is normal. No radio-opaque calculi or other significant radiographic abnormality are seen. No metallic radiopaque foreign body is identified  within the abdomen. IMPRESSION: 1. No metallic radiopaque foreign body identified within the abdomen. 2. Nonobstructive bowel gas pattern. Electronically Signed   By: Duanne Guess D.O.   On: 09/29/2020 12:36   CT Angio Neck W and/or Wo Contrast  Result Date: 09/29/2020 CLINICAL DATA:  Neuro deficit EXAM: CT ANGIOGRAPHY HEAD AND NECK TECHNIQUE: Multidetector CT imaging of the head and neck was performed using the standard protocol during bolus administration of intravenous contrast. Multiplanar CT image reconstructions and MIPs were obtained to evaluate the vascular anatomy. Carotid stenosis measurements (when applicable) are obtained utilizing NASCET criteria, using the distal internal carotid diameter as the denominator. CONTRAST:  75mL OMNIPAQUE IOHEXOL 350 MG/ML SOLN COMPARISON:  None. FINDINGS: CT HEAD FINDINGS Brain: No evidence of acute infarction, hemorrhage,  hydrocephalus, extra-axial collection or mass lesion/mass effect. Vascular: No hyperdense vessel or unexpected calcification. 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 Hyperostosis interna. Sinuses: Clear Orbits: Gaze to the right. Review of the MIP images confirms the above findings CTA NECK FINDINGS Aortic arch: No acute finding. Four vessel branching with left vertebral artery arising from the arch Right carotid system: Mild atheromatous plaque at the ICA bulb. ICA tortuosity towards the skull base. No beading or ulceration. Left carotid system: Mild atherosclerosis Vertebral arteries: No proximal subclavian stenosis on the right. The left vertebral artery arises from the arch. Calcified plaque at the left vertebral origin with streak artifact limiting lumen visualization, estimated at mild or moderate. Skeleton: Cervical spine degeneration with mild C3-4 anterolisthesis. Other neck: No acute finding. Upper chest: Negative Review of the MIP images confirms the above findings CTA HEAD FINDINGS Anterior circulation:  Plaque along the carotid siphons. No branch occlusion, flow limiting stenosis, beading, or aneurysm. Posterior circulation: The vertebral and basilar arteries are smooth and widely patent. Hypoplastic right P1 segment with fetal type PCA flow. No branch occlusion, beading, or aneurysm. 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 Critical Value/emergent results were called by telephone at the time of interpretation on 09/29/2020 at 7:11 am [facility] , who verbally acknowledged these results. Review of the MIP images confirms the above findings IMPRESSION: Head CT: No hemorrhage or visible infarct. CTA: 1. No emergent finding including large vessel occlusion. 2. Atherosclerosis without flow limiting stenosis of major vessels. Electronically Signed   By: Marnee SpringJonathon  Watts M.D.   On: 09/29/2020 07:16   MR BRAIN WO CONTRAST  Result Date: 09/29/2020 CLINICAL DATA:  Acute neuro deficit.  Altered mental status EXAM: MRI HEAD WITHOUT CONTRAST TECHNIQUE: Multiplanar, multiecho pulse sequences of the brain and surrounding structures were obtained without intravenous contrast. COMPARISON:  CT angio head neck 09/29/2020 FINDINGS: Brain: No acute infarction, hemorrhage, hydrocephalus, extra-axial collection or mass lesion. Scattered small deep white matter hyperintensities bilaterally. Brainstem and cerebellum normal. Vascular: Normal arterial flow voids. Skull and upper cervical spine: Negative Sinuses/Orbits: Mild mucosal edema paranasal sinuses. Negative orbit Other: None IMPRESSION: No acute abnormality. Mild white matter changes most consistent with chronic microvascular ischemia. Electronically Signed   By: Marlan Palauharles  Clark M.D.   On: 09/29/2020 14:01   DG Chest Portable 1 View  Result Date: 09/29/2020 CLINICAL DATA:  Confusion, weakness and altered mental status EXAM: PORTABLE CHEST 1 VIEW COMPARISON:  February 19, 2020 FINDINGS: Trachea midline. Cardiomediastinal contours  are normal accounting for low lung volumes seen on today's exam. Subtle RIGHT lung base opacity above the RIGHT hemidiaphragm. No sign of effusion. On limited assessment skeletal structures without acute process. IMPRESSION: Low volume chest with subtle RIGHT basilar opacity likely atelectasis. Electronically Signed   By: Donzetta KohutGeoffrey  Wile M.D.   On: 09/29/2020 07:54   ECHOCARDIOGRAM COMPLETE  Result Date: 09/29/2020    ECHOCARDIOGRAM REPORT   [facility] Date of Exam: 09/29/2020 Medical Rec #:  [id number]   Height:       66.0 in Accession #:    [id number]  Weight:       219.6 lb Date of Birth:  1946/05/13   BSA:          2.081 m Patient Age:    74 years    BP:           125/112 mmHg Patient Gender: F  HR:           72 bpm. Exam Location:  [facility] Procedure: 2D Echo, Color Doppler and Cardiac Doppler Indications:     I163.9 Stroke  History:         Patient has no prior history of Echocardiogram examinations. No                  medical history.  Sonographer:     Humphrey Rolls RDCS (AE) Referring Phys:  [id number] Lucile Shutters Diagnosing Phys: Julien Nordmann MD  Sonographer Comments: Suboptimal subcostal window. IMPRESSIONS  1. Left ventricular ejection fraction, by estimation, is 30 %. The left ventricle has moderate to severly decreased function. The left ventricle demonstrates global hypokinesis/apical ballooning, with basal regions best preserved consistent with stress cardiomyopathy. Left ventricular diastolic parameters are consistent with Grade I diastolic dysfunction (impaired relaxation).  2. Right ventricular systolic function is normal. The right ventricular size is normal. There is mildly elevated pulmonary artery systolic pressure. The estimated right ventricular systolic pressure is 39.8 mmHg.  3. Mild mitral valve regurgitation. FINDINGS  Left Ventricle: Left ventricular ejection fraction, by estimation, is 30 to 35%. The left ventricle has moderately decreased function. The left  ventricle demonstrates global hypokinesis. The left ventricular internal cavity size was normal in size. There is no left ventricular hypertrophy. Left ventricular diastolic parameters are consistent with Grade I diastolic dysfunction (impaired relaxation). Right Ventricle: The right ventricular size is normal. No increase in right ventricular wall thickness. Right ventricular systolic function is normal. There is mildly elevated pulmonary artery systolic pressure. The tricuspid regurgitant velocity is 2.95  m/s, and with an assumed right atrial pressure of 5 mmHg, the estimated right ventricular systolic pressure is 39.8 mmHg. Left Atrium: Left atrial size was normal in size. Right Atrium: Right atrial size was normal in size. Pericardium: There is no evidence of pericardial effusion. Mitral Valve: The mitral valve is normal in structure. Mild mitral valve regurgitation. No evidence of mitral valve stenosis. MV peak gradient, 3.6 mmHg. The mean mitral valve gradient is 1.0 mmHg. Tricuspid Valve: The tricuspid valve is normal in structure. Tricuspid valve regurgitation is mild . No evidence of tricuspid stenosis. Aortic Valve: The aortic valve is normal in structure. Aortic valve regurgitation is not visualized. No aortic stenosis is present. Aortic valve mean gradient measures 3.0 mmHg. Aortic valve peak gradient measures 4.8 mmHg. Aortic valve area, by VTI measures 1.79 cm. Pulmonic Valve: The pulmonic valve was normal in structure. Pulmonic valve regurgitation is not visualized. No evidence of pulmonic stenosis. Aorta: The aortic root is normal in size and structure. Venous: The inferior vena cava is normal in size with greater than 50% respiratory variability, suggesting right atrial pressure of 3 mmHg. IAS/Shunts: No atrial level shunt detected by color flow Doppler.  LEFT VENTRICLE PLAX 2D LVIDd:         5.01 cm  Diastology LVIDs:         2.73 cm  LV e' medial:    3.37 cm/s LV PW:         0.87 cm  LV E/e'  medial:  10.9 LV IVS:        0.81 cm  LV e' lateral:   3.15 cm/s LVOT diam:     2.10 cm  LV E/e' lateral: 11.7 LV SV:         37 LV SV Index:   18 LVOT Area:     3.46 cm  RIGHT VENTRICLE RV Basal diam:  3.70 cm LEFT ATRIUM             Index       RIGHT ATRIUM          Index LA diam:        3.30 cm 1.59 cm/m  RA Area:     9.73 cm LA Vol (A2C):   36.3 ml 17.45 ml/m RA Volume:   18.40 ml 8.84 ml/m LA Vol (A4C):   21.7 ml 10.43 ml/m LA Biplane Vol: 30.7 ml 14.75 ml/m  AORTIC VALVE                   PULMONIC VALVE AV Area (Vmax):    1.77 cm    PV Vmax:       0.97 m/s AV Area (Vmean):   1.82 cm    PV Vmean:      65.500 cm/s AV Area (VTI):     1.79 cm    PV VTI:        0.197 m AV Vmax:           110.00 cm/s PV Peak grad:  3.8 mmHg AV Vmean:          78.300 cm/s PV Mean grad:  2.0 mmHg AV VTI:            0.207 m AV Peak Grad:      4.8 mmHg AV Mean Grad:      3.0 mmHg LVOT Vmax:         56.10 cm/s LVOT Vmean:        41.100 cm/s LVOT VTI:          0.107 m LVOT/AV VTI ratio: 0.52  AORTA Ao Root diam: 2.90 cm MITRAL VALVE               TRICUSPID VALVE MV Area (PHT): 7.66 cm    TR Peak grad:   34.8 mmHg MV Peak grad:  3.6 mmHg    TR Vmax:        295.00 cm/s MV Mean grad:  1.0 mmHg MV Vmax:       0.95 m/s    SHUNTS MV Vmean:      48.8 cm/s   Systemic VTI:  0.11 m MV Decel Time: 99 msec     Systemic Diam: 2.10 cm MV E velocity: 36.80 cm/s MV A velocity: 96.80 cm/s MV E/A ratio:  0.38 Julien Nordmann MD Electronically signed by Julien Nordmann MD Signature Date/Time: 09/29/2020/2:52:45 PM    Final     EKG: EKG has normal sinus rhythm 60 I do not agree with atrial flutter  ASSESSMENT AND PLAN:  Elevated troponin Possible non-STEMI Cardiomyopathy EF of 30% Obesity Seizure disorder Hypertension Mildly abnormal EKG Hyponatremia Hypokalemia . Plan Agree with admit to telemetry Recommend follow-up EKGs and troponins Initial echocardiogram with significant cardiomyopathy unclear etiology Recommend short-term  anticoagulation with heparin for 24 to 48 hours Transition to aspirin and Plavix for at least 6 months We will consider cardiac cath possibly as an outpatient Recommend ACE inhibitor or switching to Entresto add Coreg low-dose diuretics Recommend treatment for congestive heart failure systolic dysfunction cardiomyopathy Patient may at some point require invasive evaluation to rule out coronary disease as an etiology Continue therapy for seizure disorder Agree with correcting electrolytes  Signed: Alwyn Pea MD 09/30/2020, 10:06 AM

## 2020-09-30 NOTE — Progress Notes (Signed)
ANTICOAGULATION CONSULT NOTE - Initial Consult  Pharmacy Consult for heparin Indication: chest pain/ACS  Allergies  Allergen Reactions  . Sulfa Antibiotics   . Sulfur Swelling and Rash    Patient Measurements: Height: 5\' 2"  (157.5 cm) Weight: 91.9 kg (202 lb 9.6 oz) IBW/kg (Calculated) : 50.1 Heparin Dosing Weight: 71.4 kg  Vital Signs: Temp: 98.7 F (37.1 C) (10/02 1209) Temp Source: Oral (10/02 1209) BP: 124/83 (10/02 1209) Pulse Rate: 89 (10/02 1209)  Labs: Recent Labs    09/29/20 0635 09/30/20 0502  HGB 12.2  --   HCT 34.2*  --   PLT 253  --   APTT 25  --   LABPROT 12.0  --   INR 0.9  --   CREATININE 1.18* 0.95  TROPONINIHS  --  7,421*    Estimated Creatinine Clearance: 55.6 mL/min (by C-G formula based on SCr of 0.95 mg/dL).  Assessment: 74 y.o. female presenting with seizure and found to have minimal ST elevation on EKG, no chest pain, elevated troponins. Pharmacy has been consulted for IV heparin dosing while cardiac workup in progress.  No prior to admission anticoagulation noted. Hgb and platelets WNL. No overt bleeding noted.   Goal of Therapy:  Heparin level 0.3-0.7 units/ml Monitor platelets by anticoagulation protocol: Yes  Plan:  Heparin bolus of 4000 units x 1 Then start heparin infusion at 900 units/hr Check 6 hour heparin level Monitor CBC, daily heparin level  Continue to monitor for signs/symptoms of bleeding F/u transition to oral anticoagulant or antiplatelet agents as appropriate   66, PharmD Clinical Pharmacist  09/30/2020   3:40 PM

## 2020-09-30 NOTE — Progress Notes (Signed)
Subjective: No further seizures noted.  Patient tolerating Keppra  Objective: Current vital signs: BP 124/83 (BP Location: Left Arm)   Pulse 89   Temp 98.7 F (37.1 C) (Oral)   Resp 17   Ht 5\' 2"  (1.575 m)   Wt 91.9 kg   SpO2 94%   BMI 37.06 kg/m  Vital signs in last 24 hours: Temp:  [97.8 F (36.6 C)-98.9 F (37.2 C)] 98.7 F (37.1 C) (10/02 1209) Pulse Rate:  [69-98] 89 (10/02 1209) Resp:  [11-21] 17 (10/02 1209) BP: (123-156)/(80-99) 124/83 (10/02 1209) SpO2:  [94 %-99 %] 94 % (10/02 1209) Weight:  [91.9 kg] 91.9 kg (10/02 0000)  Intake/Output from previous day: 10/01 0701 - 10/02 0700 In: 475 [I.V.:475] Out: 2250 [Urine:2250] Intake/Output this shift: Total I/O In: -  Out: 300 [Urine:300] Nutritional status:  Diet Order            Diet clear liquid Room service appropriate? Yes; Fluid consistency: Thin  Diet effective now                 Neurologic Exam: Mental Status: Alert, oriented, thought content appropriate.  Speech fluent without evidence of aphasia.  Able to follow 3 step commands without difficulty. Cranial Nerves: II: Discs flat bilaterally; Visual fields grossly normal, pupils equal, round, reactive to light and accommodation III,IV, VI: ptosis not present, extra-ocular motions intact bilaterally V,VII: mild decrease in right NLF VIII: hearing normal bilaterally IX,X: gag reflex present XI: bilateral shoulder shrug XII: midline tongue extension Motor: Right : Upper extremity   5/5    Left:     Upper extremity   5/5  Lower extremity   5/5     Lower extremity   5/5 Tone and bulk:normal tone throughout; no atrophy noted Sensory: Pinprick and light touch intact throughout, bilaterally   Lab Results: Basic Metabolic Panel: Recent Labs  Lab 09/29/20 0635 09/30/20 0502  NA 129* 133*  K 3.4* 4.5  CL 96* 100  CO2 20* 24  GLUCOSE 113* 102*  BUN 9 7*  CREATININE 1.18* 0.95  CALCIUM 9.1 9.5  MG  --  1.4*    Liver Function  Tests: Recent Labs  Lab 09/29/20 0635 09/30/20 0502  AST 28 47*  ALT 17 18  ALKPHOS 48 55  BILITOT 0.9 0.9  PROT 6.9 6.6  ALBUMIN 4.4 4.2   No results for input(s): LIPASE, AMYLASE in the last 168 hours. No results for input(s): AMMONIA in the last 168 hours.  CBC: Recent Labs  Lab 09/29/20 0635  WBC 8.0  NEUTROABS 5.0  HGB 12.2  HCT 34.2*  MCV 84.0  PLT 253    Cardiac Enzymes: No results for input(s): CKTOTAL, CKMB, CKMBINDEX, TROPONINI in the last 168 hours.  Lipid Panel: No results for input(s): CHOL, TRIG, HDL, CHOLHDL, VLDL, LDLCALC in the last 168 hours.  CBG: Recent Labs  Lab 09/29/20 0637  GLUCAP 130*    Microbiology: Results for orders placed or performed during the hospital encounter of 09/29/20  Respiratory Panel by RT PCR (Flu A&B, Covid) - Nasopharyngeal Swab     Status: None   Collection Time: 09/29/20  8:05 AM   Specimen: Nasopharyngeal Swab  Result Value Ref Range Status   SARS Coronavirus 2 by RT PCR NEGATIVE NEGATIVE Final    Comment: (NOTE) SARS-CoV-2 target nucleic acids are NOT DETECTED.  The SARS-CoV-2 RNA is generally detectable in upper respiratoy specimens during the acute phase of infection. The lowest concentration of SARS-CoV-2 viral  copies this assay can detect is 131 copies/mL. A negative result does not preclude SARS-Cov-2 infection and should not be used as the sole basis for treatment or other patient management decisions. A negative result may occur with  improper specimen collection/handling, submission of specimen other than nasopharyngeal swab, presence of viral mutation(s) within the areas targeted by this assay, and inadequate number of viral copies (<131 copies/mL). A negative result must be combined with clinical observations, patient history, and epidemiological information. The expected result is Negative.  Fact Sheet for Patients:  https://www.moore.com/  Fact Sheet for Healthcare  Providers:  https://www.young.biz/  This test is no t yet approved or cleared by the [place] FDA and  has been authorized for detection and/or diagnosis of SARS-CoV-2 by FDA under an Emergency Use Authorization (EUA). This EUA will remain  in effect (meaning this test can be used) for the duration of the COVID-19 declaration under Section 564(b)(1) of the Act, 21 U.S.C. section 360bbb-3(b)(1), unless the authorization is terminated or revoked sooner.     Influenza A by PCR NEGATIVE NEGATIVE Final   Influenza B by PCR NEGATIVE NEGATIVE Final    Comment: (NOTE) The Xpert Xpress SARS-CoV-2/FLU/RSV assay is intended as an aid in  the diagnosis of influenza from Nasopharyngeal swab specimens and  should not be used as a sole basis for treatment. Nasal washings and  aspirates are unacceptable for Xpert Xpress SARS-CoV-2/FLU/RSV  testing.  Fact Sheet for Patients: https://www.moore.com/  Fact Sheet for Healthcare Providers: https://www.young.biz/  This test is not yet approved or cleared by the [place] FDA and  has been authorized for detection and/or diagnosis of SARS-CoV-2 by  FDA under an Emergency Use Authorization (EUA). This EUA will remain  in effect (meaning this test can be used) for the duration of the  Covid-19 declaration under Section 564(b)(1) of the Act, 21  U.S.C. section 360bbb-3(b)(1), unless the authorization is  terminated or revoked. Performed at [facility], [address] [place], [place] [address]     Coagulation Studies: Recent Labs    09/29/20 7035  LABPROT 12.0  INR 0.9    Imaging: EEG  Result Date: 09/29/2020 Charlsie Quest, MD     09/29/2020  5:28 PM Patient Name: Rufus Cypert MRN: [id number] Epilepsy Attending: Charlsie Quest Referring Physician/Provider: Dr Ritta Slot Date: 09/29/2020 Duration: 21.51 mins Patient history: 74 y.o. female with no history  of seizures per the patient who his been having some trouble with her speech since she woke up yesterday morning (per patient). EEG to evaluate for seizure. Level of alertness: Awake, asleep AEDs during EEG study: LEV Technical aspects: This EEG study was done with scalp electrodes positioned according to the 10-20 International system of electrode placement. Electrical activity was acquired at a sampling rate of 500Hz  and reviewed with a high frequency filter of 70Hz  and a low frequency filter of 1Hz . EEG data were recorded continuously and digitally stored. Description: No posterior dominant rhythm was seen. Sleep was characterized by sleep spindles (12 to 14 Hz), maximal frontocentral region. EEG showed continuous generalized 3 to 6 Hz theta-delta slowing. Physiologic photic driving was not seen during photic stimulation.  Hyperventilation was not performed.   ABNORMALITY -Continuous slow, generalized IMPRESSION: This study is suggestive of moderate diffuse encephalopathy, nonspecific etiology. No seizures or epileptiform discharges were seen throughout the recording.   CT Angio Head W or Wo Contrast  Result Date: 09/29/2020 CLINICAL DATA:  Neuro deficit EXAM: CT ANGIOGRAPHY  HEAD AND NECK TECHNIQUE: Multidetector CT imaging of the head and neck was performed using the standard protocol during bolus administration of intravenous contrast. Multiplanar CT image reconstructions and MIPs were obtained to evaluate the vascular anatomy. Carotid stenosis measurements (when applicable) are obtained utilizing NASCET criteria, using the distal internal carotid diameter as the denominator. CONTRAST:  65mL OMNIPAQUE IOHEXOL 350 MG/ML SOLN COMPARISON:  None. FINDINGS: CT HEAD FINDINGS Brain: No evidence of acute infarction, hemorrhage, hydrocephalus, extra-axial collection or mass lesion/mass effect. Vascular: No hyperdense vessel or unexpected calcification. 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 Hyperostosis interna. Sinuses: Clear Orbits: Gaze to the right. Review of the MIP images confirms the above findings CTA NECK FINDINGS Aortic arch: No acute finding. Four vessel branching with left vertebral artery arising from the arch Right carotid system: Mild atheromatous plaque at the ICA bulb. ICA tortuosity towards the skull base. No beading or ulceration. Left carotid system: Mild atherosclerosis Vertebral arteries: No proximal subclavian stenosis on the right. The left vertebral artery arises from the arch. Calcified plaque at the left vertebral origin with streak artifact limiting lumen visualization, estimated at mild or moderate. Skeleton: Cervical spine degeneration with mild C3-4 anterolisthesis. Other neck: No acute finding. Upper chest: Negative Review of the MIP images confirms the above findings CTA HEAD FINDINGS Anterior circulation: Plaque along the carotid siphons. No branch occlusion, flow limiting stenosis, beading, or aneurysm. Posterior circulation: The vertebral and basilar arteries are smooth and widely patent. Hypoplastic right P1 segment with fetal type PCA flow. No branch occlusion, beading, or aneurysm. 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 Critical Value/emergent results were called by telephone at the time of interpretation on 09/29/2020 at 7:11 am to provider [facility] , who verbally acknowledged these results. Review of the MIP images confirms the above findings IMPRESSION: Head CT: No hemorrhage or visible infarct. CTA: 1. No emergent finding including large vessel occlusion. 2. Atherosclerosis without flow limiting stenosis of major vessels. Electronically Signed   By: Marnee Spring M.D.   On: 09/29/2020 07:16   DG Abd 1 View  Result Date: 09/29/2020 CLINICAL DATA:  Evaluate for metallic foreign body prior to MRI EXAM: ABDOMEN - 1 VIEW COMPARISON:  None. FINDINGS: The bowel gas pattern is normal. No radio-opaque  calculi or other significant radiographic abnormality are seen. No metallic radiopaque foreign body is identified within the abdomen. IMPRESSION: 1. No metallic radiopaque foreign body identified within the abdomen. 2. Nonobstructive bowel gas pattern. Electronically Signed   By: Duanne Guess D.O.   On: 09/29/2020 12:36   CT Angio Neck W and/or Wo Contrast  Result Date: 09/29/2020 CLINICAL DATA:  Neuro deficit EXAM: CT ANGIOGRAPHY HEAD AND NECK TECHNIQUE: Multidetector CT imaging of the head and neck was performed using the standard protocol during bolus administration of intravenous contrast. Multiplanar CT image reconstructions and MIPs were obtained to evaluate the vascular anatomy. Carotid stenosis measurements (when applicable) are obtained utilizing NASCET criteria, using the distal internal carotid diameter as the denominator. CONTRAST:  27mL OMNIPAQUE IOHEXOL 350 MG/ML SOLN COMPARISON:  None. FINDINGS: CT HEAD FINDINGS Brain: No evidence of acute infarction, hemorrhage, hydrocephalus, extra-axial collection or mass lesion/mass effect. Vascular: No hyperdense vessel or unexpected calcification. 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 Hyperostosis interna. Sinuses: Clear Orbits: Gaze to the right. Review of the MIP images confirms the above findings CTA NECK FINDINGS Aortic arch: No acute finding. Four vessel branching with left vertebral artery arising from the  arch Right carotid system: Mild atheromatous plaque at the ICA bulb. ICA tortuosity towards the skull base. No beading or ulceration. Left carotid system: Mild atherosclerosis Vertebral arteries: No proximal subclavian stenosis on the right. The left vertebral artery arises from the arch. Calcified plaque at the left vertebral origin with streak artifact limiting lumen visualization, estimated at mild or moderate. Skeleton: Cervical spine degeneration with mild C3-4 anterolisthesis. Other neck: No acute finding. Upper  chest: Negative Review of the MIP images confirms the above findings CTA HEAD FINDINGS Anterior circulation: Plaque along the carotid siphons. No branch occlusion, flow limiting stenosis, beading, or aneurysm. Posterior circulation: The vertebral and basilar arteries are smooth and widely patent. Hypoplastic right P1 segment with fetal type PCA flow. No branch occlusion, beading, or aneurysm. 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 Critical Value/emergent results were called by telephone at the time of interpretation on 09/29/2020 at 7:11 am to provider [facility] , who verbally acknowledged these results. Review of the MIP images confirms the above findings IMPRESSION: Head CT: No hemorrhage or visible infarct. CTA: 1. No emergent finding including large vessel occlusion. 2. Atherosclerosis without flow limiting stenosis of major vessels. Electronically Signed   By: Marnee Spring M.D.   On: 09/29/2020 07:16   MR BRAIN WO CONTRAST  Result Date: 09/29/2020 CLINICAL DATA:  Acute neuro deficit.  Altered mental status EXAM: MRI HEAD WITHOUT CONTRAST TECHNIQUE: Multiplanar, multiecho pulse sequences of the brain and surrounding structures were obtained without intravenous contrast. COMPARISON:  CT angio head neck 09/29/2020 FINDINGS: Brain: No acute infarction, hemorrhage, hydrocephalus, extra-axial collection or mass lesion. Scattered small deep white matter hyperintensities bilaterally. Brainstem and cerebellum normal. Vascular: Normal arterial flow voids. Skull and upper cervical spine: Negative Sinuses/Orbits: Mild mucosal edema paranasal sinuses. Negative orbit Other: None IMPRESSION: No acute abnormality. Mild white matter changes most consistent with chronic microvascular ischemia. Electronically Signed   By: Marlan Palau M.D.   On: 09/29/2020 14:01   DG Chest Portable 1 View  Result Date: 09/29/2020 CLINICAL DATA:  Confusion, weakness and altered mental status  EXAM: PORTABLE CHEST 1 VIEW COMPARISON:  February 19, 2020 FINDINGS: Trachea midline. Cardiomediastinal contours are normal accounting for low lung volumes seen on today's exam. Subtle RIGHT lung base opacity above the RIGHT hemidiaphragm. No sign of effusion. On limited assessment skeletal structures without acute process. IMPRESSION: Low volume chest with subtle RIGHT basilar opacity likely atelectasis. Electronically Signed   By: Donzetta Kohut M.D.   On: 09/29/2020 07:54   ECHOCARDIOGRAM COMPLETE  Result Date: 09/29/2020    ECHOCARDIOGRAM REPORT   Patient Name:   [facility] Date of Exam: 09/29/2020 Medical Rec #:  [id number]   Height:       66.0 in Accession #:    [id number]  Weight:       219.6 lb Date of Birth:  1946/01/28   BSA:          2.081 m Patient Age:    74 years    BP:           125/112 mmHg Patient Gender: F           HR:           72 bpm. Exam Location:  [facility] Procedure: 2D Echo, Color Doppler and Cardiac Doppler Indications:     I163.9 Stroke  History:         Patient has no prior history of Echocardiogram examinations. No  medical history.  Sonographer:     Humphrey Rolls RDCS (AE) Referring Phys:  [id number] Lucile Shutters Diagnosing Phys: Julien Nordmann MD  Sonographer Comments: Suboptimal subcostal window. IMPRESSIONS  1. Left ventricular ejection fraction, by estimation, is 30 %. The left ventricle has moderate to severly decreased function. The left ventricle demonstrates global hypokinesis/apical ballooning, with basal regions best preserved consistent with stress cardiomyopathy. Left ventricular diastolic parameters are consistent with Grade I diastolic dysfunction (impaired relaxation).  2. Right ventricular systolic function is normal. The right ventricular size is normal. There is mildly elevated pulmonary artery systolic pressure. The estimated right ventricular systolic pressure is 39.8 mmHg.  3. Mild mitral valve regurgitation. FINDINGS  Left Ventricle: Left ventricular  ejection fraction, by estimation, is 30 to 35%. The left ventricle has moderately decreased function. The left ventricle demonstrates global hypokinesis. The left ventricular internal cavity size was normal in size. There is no left ventricular hypertrophy. Left ventricular diastolic parameters are consistent with Grade I diastolic dysfunction (impaired relaxation). Right Ventricle: The right ventricular size is normal. No increase in right ventricular wall thickness. Right ventricular systolic function is normal. There is mildly elevated pulmonary artery systolic pressure. The tricuspid regurgitant velocity is 2.95  m/s, and with an assumed right atrial pressure of 5 mmHg, the estimated right ventricular systolic pressure is 39.8 mmHg. Left Atrium: Left atrial size was normal in size. Right Atrium: Right atrial size was normal in size. Pericardium: There is no evidence of pericardial effusion. Mitral Valve: The mitral valve is normal in structure. Mild mitral valve regurgitation. No evidence of mitral valve stenosis. MV peak gradient, 3.6 mmHg. The mean mitral valve gradient is 1.0 mmHg. Tricuspid Valve: The tricuspid valve is normal in structure. Tricuspid valve regurgitation is mild . No evidence of tricuspid stenosis. Aortic Valve: The aortic valve is normal in structure. Aortic valve regurgitation is not visualized. No aortic stenosis is present. Aortic valve mean gradient measures 3.0 mmHg. Aortic valve peak gradient measures 4.8 mmHg. Aortic valve area, by VTI measures 1.79 cm. Pulmonic Valve: The pulmonic valve was normal in structure. Pulmonic valve regurgitation is not visualized. No evidence of pulmonic stenosis. Aorta: The aortic root is normal in size and structure. Venous: The inferior vena cava is normal in size with greater than 50% respiratory variability, suggesting right atrial pressure of 3 mmHg. IAS/Shunts: No atrial level shunt detected by color flow Doppler.  LEFT VENTRICLE PLAX 2D LVIDd:          5.01 cm  Diastology LVIDs:         2.73 cm  LV e' medial:    3.37 cm/s LV PW:         0.87 cm  LV E/e' medial:  10.9 LV IVS:        0.81 cm  LV e' lateral:   3.15 cm/s LVOT diam:     2.10 cm  LV E/e' lateral: 11.7 LV SV:         37 LV SV Index:   18 LVOT Area:     3.46 cm  RIGHT VENTRICLE RV Basal diam:  3.70 cm LEFT ATRIUM             Index       RIGHT ATRIUM          Index LA diam:        3.30 cm 1.59 cm/m  RA Area:     9.73 cm LA Vol (A2C):   36.3 ml 17.45 ml/m RA Volume:  18.40 ml 8.84 ml/m LA Vol (A4C):   21.7 ml 10.43 ml/m LA Biplane Vol: 30.7 ml 14.75 ml/m  AORTIC VALVE                   PULMONIC VALVE AV Area (Vmax):    1.77 cm    PV Vmax:       0.97 m/s AV Area (Vmean):   1.82 cm    PV Vmean:      65.500 cm/s AV Area (VTI):     1.79 cm    PV VTI:        0.197 m AV Vmax:           110.00 cm/s PV Peak grad:  3.8 mmHg AV Vmean:          78.300 cm/s PV Mean grad:  2.0 mmHg AV VTI:            0.207 m AV Peak Grad:      4.8 mmHg AV Mean Grad:      3.0 mmHg LVOT Vmax:         56.10 cm/s LVOT Vmean:        41.100 cm/s LVOT VTI:          0.107 m LVOT/AV VTI ratio: 0.52  AORTA Ao Root diam: 2.90 cm MITRAL VALVE               TRICUSPID VALVE MV Area (PHT): 7.66 cm    TR Peak grad:   34.8 mmHg MV Peak grad:  3.6 mmHg    TR Vmax:        295.00 cm/s MV Mean grad:  1.0 mmHg MV Vmax:       0.95 m/s    SHUNTS MV Vmean:      48.8 cm/s   Systemic VTI:  0.11 m MV Decel Time: 99 msec     Systemic Diam: 2.10 cm MV E velocity: 36.80 cm/s MV A velocity: 96.80 cm/s MV E/A ratio:  0.38 Julien Nordmann MD Electronically signed by Julien Nordmann MD Signature Date/Time: 09/29/2020/2:52:45 PM    Final     Medications:  I have reviewed the patient's current medications. Scheduled: . carvedilol  3.125 mg Oral BID WC  . DULoxetine  30 mg Oral Daily  . enoxaparin (LOVENOX) injection  40 mg Subcutaneous Q24H  . ezetimibe  10 mg Oral Daily  . levETIRAcetam  500 mg Oral BID  . pantoprazole  40 mg Oral Daily  .  sacubitril-valsartan  1 tablet Oral BID  . traZODone  150 mg Oral QHS    Assessment/Plan: 74 year old female with new onset seizure.  Started on Keppra and tolerating well.  Unclear etiology for seizure.  Patient on opiates long term and reports she has been taking as prescribed with no recent changes in dose.  EEG only significant for slowing.  MRI of the brain personally reviewed and shows no acute changes.  Magnesium only mildly decreased at 1.4.    Recommendations: 1. Continue Keppra at 500mg  BID 2. Continue seizure precautions 3. Patient unable to drive, operate heavy machinery, perform activities at heights and participate in water activities until release by outpatient physician. 4. Patient to follow up with neurology on an outpatient basis after discharge.     LOS: 0 days   , MD Neurology [phone] 09/30/2020  12:44 PM

## 2020-09-30 NOTE — Progress Notes (Addendum)
Progress Note    Sejal Cofield  LZJ:[id number] DOB: 02/27/1946  DOA: 09/29/2020 PCP: Patient, No Pcp Per      Brief Narrative:    Medical records reviewed and are as summarized below:  Lauria Depoy is a 74 y.o. female with medical history significant for hypertension, hyperlipidemia, was initially brought in as a [name] by a neighbor. She was later identified as Pearson Grippe. She was brought to the hospital because of altered mental status. Reportedly, she was unable to speak in full sentences when EMS picked her up. Apparently, she was nonverbal in the ED and possibly had a seizure event while she was under CT scanner table in the radiology unit. She was given IV Ativan for this event.  She was admitted to the hospital for seizure and she was treated with IV Keppra and subsequently switched to oral Keppra. She was evaluated by the neurologist. EEG did not show any epileptiform activity.  As part of the work-up, 2D echo was obtained and there was an incidental finding of low EF of 30 to 35% and findings suggestive of stress-induced cardiomyopathy. Troponin was therefore obtained and this came back elevated at 7,421. Cardiologist was consulted for stress-induced cardiomyopathy and possible NSTEMI. She was started on IV heparin infusion, carvedilol, Entresto and aspirin.    Assessment/Plan:   Principal Problem:   Seizure (HCC) Active Problems:   Hyponatremia   Hypokalemia   Stress-induced cardiomyopathy   NSTEMI (non-ST elevated myocardial infarction) (HCC)   Hypomagnesemia  Hypokalemia-improved Hypomagnesemia  Body mass index is 37.06 kg/m.  (Morbid obesity)   PLAN  2D echo showed EF estimated at 30 to 35% and findings suggestive of stress-induced cardiomyopathy.  Consequently, troponin was ordered and it came back elevated at 7,421.  Dr. Juliann Pares, cardiologist, was consultedfor this and he recommended IV heparin infusion, dual antiplatelet therapy with aspirin and Plavix,  Coreg and Entresto.  Cardiac cath will be considered as an outpatient.   Check lipid panel, hemoglobin A1c.  Repeat EKG.  EEG did not show any evidence of epileptic activity. Continue Keppra for seizure disorder  Replete magnesium with IV magnesium sulfate.  Hold meloxicam.  Plan of care was discussed with Vonda.   Diet Order            Diet Heart Room service appropriate? Yes; Fluid consistency: Thin  Diet effective now                    Consultants:  Neurologist  Cardiologist  Procedures:  EEG    Medications:   . aspirin  81 mg Oral Daily  . carvedilol  3.125 mg Oral BID WC  . DULoxetine  30 mg Oral Daily  . ezetimibe  10 mg Oral Daily  . heparin  4,000 Units Intravenous Once  . levETIRAcetam  500 mg Oral BID  . pantoprazole  40 mg Oral Daily  . sacubitril-valsartan  1 tablet Oral BID  . traZODone  150 mg Oral QHS   Continuous Infusions: . heparin    . magnesium sulfate bolus IVPB 4 g (09/30/20 1604)     Anti-infectives (From admission, onward)   None             Family Communication/Anticipated D/C date and plan/Code Status   DVT prophylaxis:      Code Status: Full Code  Family Communication: Plan discussed with her daughter, Waldron Session. Disposition Plan:    Status is: Inpatient  Remains inpatient appropriate because:IV treatments appropriate due  to intensity of illness or inability to take PO and Inpatient level of care appropriate due to severity of illness   Dispo: The patient is from: Home              Anticipated d/c is to: Home              Anticipated d/c date is: 2 days              Patient currently is not medically stable to d/c.               Subjective:   No complaints.  No chest pain or shortness of breath.  Objective:    Vitals:   09/30/20 0755 09/30/20 1209 09/30/20 1547 09/30/20 1605  BP: (!) 141/86 124/83 (!) 85/55 101/67  Pulse: 87 89 83 86  Resp: Temp: 98.9 F (37.2 C)  98.7 F (37.1 C) 97.8 F (36.6 C)   TempSrc: Oral Oral Oral   SpO2: 95% 94% 96% 96%  Weight:      Height:       No data found.   Intake/Output Summary (Last 24 hours) at 09/30/2020 1608 Last data filed at 09/30/2020 1548 Gross per 24 hour  Intake 475 ml  Output 1325 ml  Net -850 ml   Filed Weights   09/29/20 0701 09/30/20 0000  Weight: 99.6 kg 91.9 kg    Exam:  GEN: NAD SKIN: No rash EYES: EOMI ENT: MMM CV: RRR PULM: CTA B ABD: soft, ND, NT, +BS CNS: AAO x 3, non focal EXT: No edema or tenderness   Data Reviewed:   I have personally reviewed following labs and imaging studies:  Labs: Labs show the following:   Basic Metabolic Panel: Recent Labs  Lab 09/29/20 0635 09/30/20 0502  NA 129* 133*  K 3.4* 4.5  CL 96* 100  CO2 20* 24  GLUCOSE 113* 102*  BUN 9 7*  CREATININE 1.18* 0.95  CALCIUM 9.1 9.5  MG  --  1.4*   GFR Estimated Creatinine Clearance: 55.6 mL/min (by C-G formula based on SCr of 0.95 mg/dL). Liver Function Tests: Recent Labs  Lab 09/29/20 0635 09/30/20 0502  AST 28 47*  ALT 17 18  ALKPHOS 48 55  BILITOT 0.9 0.9  PROT 6.9 6.6  ALBUMIN 4.4 4.2   No results for input(s): LIPASE, AMYLASE in the last 168 hours. No results for input(s): AMMONIA in the last 168 hours. Coagulation profile Recent Labs  Lab 09/29/20 0635  INR 0.9    CBC: Recent Labs  Lab 09/29/20 0635  WBC 8.0  NEUTROABS 5.0  HGB 12.2  HCT 34.2*  MCV 84.0  PLT 253   Cardiac Enzymes: No results for input(s): CKTOTAL, CKMB, CKMBINDEX, TROPONINI in the last 168 hours. BNP (last 3 results) No results for input(s): PROBNP in the last 8760 hours. CBG: Recent Labs  Lab 09/29/20 0637  GLUCAP 130*   D-Dimer: No results for input(s): DDIMER in the last 72 hours. Hgb A1c: No results for input(s): HGBA1C in the last 72 hours. Lipid Profile: Recent Labs    09/30/20 1513  CHOL 242*  HDL 59  LDLCALC 166*  TRIG 84  CHOLHDL 4.1   Thyroid function  studies: No results for input(s): TSH, T4TOTAL, T3FREE, THYROIDAB in the last 72 hours.  Invalid input(s): FREET3 Anemia work up: No results for input(s): VITAMINB12, FOLATE, FERRITIN, TIBC, IRON, RETICCTPCT in the last 72 hours. Sepsis Labs: Recent Labs  Lab  09/29/20 0635  WBC 8.0    Microbiology Recent Results (from the past 240 hour(s))  Respiratory Panel by RT PCR (Flu A&B, Covid) - Nasopharyngeal Swab     Status: None   Collection Time: 09/29/20  8:05 AM   Specimen: Nasopharyngeal Swab  Result Value Ref Range Status   SARS Coronavirus 2 by RT PCR NEGATIVE NEGATIVE Final    Comment: (NOTE) SARS-CoV-2 target nucleic acids are NOT DETECTED.  The SARS-CoV-2 RNA is generally detectable in upper respiratoy specimens during the acute phase of infection. The lowest concentration of SARS-CoV-2 viral copies this assay can detect is 131 copies/mL. A negative result does not preclude SARS-Cov-2 infection and should not be used as the sole basis for treatment or other patient management decisions. A negative result may occur with  improper specimen collection/handling, submission of specimen other than nasopharyngeal swab, presence of viral mutation(s) within the areas targeted by this assay, and inadequate number of viral copies (<131 copies/mL). A negative result must be combined with clinical observations, patient history, and epidemiological information. The expected result is Negative.  Fact Sheet for Patients:  https://www.moore.com/  Fact Sheet for Healthcare Providers:  https://www.young.biz/  This test is no t yet approved or cleared by the [place] FDA and  has been authorized for detection and/or diagnosis of SARS-CoV-2 by FDA under an Emergency Use Authorization (EUA). This EUA will remain  in effect (meaning this test can be used) for the duration of the COVID-19 declaration under Section 564(b)(1) of the Act, 21  U.S.C. section 360bbb-3(b)(1), unless the authorization is terminated or revoked sooner.     Influenza A by PCR NEGATIVE NEGATIVE Final   Influenza B by PCR NEGATIVE NEGATIVE Final    Comment: (NOTE) The Xpert Xpress SARS-CoV-2/FLU/RSV assay is intended as an aid in  the diagnosis of influenza from Nasopharyngeal swab specimens and  should not be used as a sole basis for treatment. Nasal washings and  aspirates are unacceptable for Xpert Xpress SARS-CoV-2/FLU/RSV  testing.  Fact Sheet for Patients: https://www.moore.com/  Fact Sheet for Healthcare Providers: https://www.young.biz/  This test is not yet approved or cleared by the [place] FDA and  has been authorized for detection and/or diagnosis of SARS-CoV-2 by  FDA under an Emergency Use Authorization (EUA). This EUA will remain  in effect (meaning this test can be used) for the duration of the  Covid-19 declaration under Section 564(b)(1) of the Act, 21  U.S.C. section 360bbb-3(b)(1), unless the authorization is  terminated or revoked. Performed at [facility], [address] [place] [place], [place] [address]     Procedures and diagnostic studies:  EEG  Result Date: 09/29/2020 Charlsie Quest, MD     09/29/2020  5:28 PM Patient Name: Connie Lasater MRN: [id number] Epilepsy Attending: Charlsie Quest Referring Physician/Provider: Dr Ritta Slot Date: 09/29/2020 Duration: 21.51 mins Patient history: 74 y.o. female with no history of seizures per the patient who his been having some trouble with her speech since she woke up yesterday morning (per patient). EEG to evaluate for seizure. Level of alertness: Awake, asleep AEDs during EEG study: LEV Technical aspects: This EEG study was done with scalp electrodes positioned according to the 10-20 International system of electrode placement. Electrical activity was acquired at a sampling rate of 500Hz  and reviewed with a high  frequency filter of 70Hz  and a low frequency filter of 1Hz . EEG data were recorded continuously and digitally stored. Description: No posterior dominant rhythm was seen. Sleep was characterized  by sleep spindles (12 to 14 Hz), maximal frontocentral region. EEG showed continuous generalized 3 to 6 Hz theta-delta slowing. Physiologic photic driving was not seen during photic stimulation.  Hyperventilation was not performed.   ABNORMALITY -Continuous slow, generalized IMPRESSION: This study is suggestive of moderate diffuse encephalopathy, nonspecific etiology. No seizures or epileptiform discharges were seen throughout the recording. Charlsie Quest   CT Angio Head W or Wo Contrast  Result Date: 09/29/2020 CLINICAL DATA:  Neuro deficit EXAM: CT ANGIOGRAPHY HEAD AND NECK TECHNIQUE: Multidetector CT imaging of the head and neck was performed using the standard protocol during bolus administration of intravenous contrast. Multiplanar CT image reconstructions and MIPs were obtained to evaluate the vascular anatomy. Carotid stenosis measurements (when applicable) are obtained utilizing NASCET criteria, using the distal internal carotid diameter as the denominator. CONTRAST:  75mL OMNIPAQUE IOHEXOL 350 MG/ML SOLN COMPARISON:  None. FINDINGS: CT HEAD FINDINGS Brain: No evidence of acute infarction, hemorrhage, hydrocephalus, extra-axial collection or mass lesion/mass effect. Vascular: No hyperdense vessel or unexpected calcification. 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 Hyperostosis interna. Sinuses: Clear Orbits: Gaze to the right. Review of the MIP images confirms the above findings CTA NECK FINDINGS Aortic arch: No acute finding. Four vessel branching with left vertebral artery arising from the arch Right carotid system: Mild atheromatous plaque at the ICA bulb. ICA tortuosity towards the skull base. No beading or ulceration. Left carotid system: Mild atherosclerosis Vertebral arteries:  No proximal subclavian stenosis on the right. The left vertebral artery arises from the arch. Calcified plaque at the left vertebral origin with streak artifact limiting lumen visualization, estimated at mild or moderate. Skeleton: Cervical spine degeneration with mild C3-4 anterolisthesis. Other neck: No acute finding. Upper chest: Negative Review of the MIP images confirms the above findings CTA HEAD FINDINGS Anterior circulation: Plaque along the carotid siphons. No branch occlusion, flow limiting stenosis, beading, or aneurysm. Posterior circulation: The vertebral and basilar arteries are smooth and widely patent. Hypoplastic right P1 segment with fetal type PCA flow. No branch occlusion, beading, or aneurysm. 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 Critical Value/emergent results were called by telephone at the time of interpretation on 09/29/2020 at 7:11 am to provider [facility] , who verbally acknowledged these results. Review of the MIP images confirms the above findings IMPRESSION: Head CT: No hemorrhage or visible infarct. CTA: 1. No emergent finding including large vessel occlusion. 2. Atherosclerosis without flow limiting stenosis of major vessels. Electronically Signed   By: Marnee Spring M.D.   On: 09/29/2020 07:16   DG Abd 1 View  Result Date: 09/29/2020 CLINICAL DATA:  Evaluate for metallic foreign body prior to MRI EXAM: ABDOMEN - 1 VIEW COMPARISON:  None. FINDINGS: The bowel gas pattern is normal. No radio-opaque calculi or other significant radiographic abnormality are seen. No metallic radiopaque foreign body is identified within the abdomen. IMPRESSION: 1. No metallic radiopaque foreign body identified within the abdomen. 2. Nonobstructive bowel gas pattern. Electronically Signed   By: Duanne Guess D.O.   On: 09/29/2020 12:36   CT Angio Neck W and/or Wo Contrast  Result Date: 09/29/2020 CLINICAL DATA:  Neuro deficit EXAM: CT ANGIOGRAPHY HEAD  AND NECK TECHNIQUE: Multidetector CT imaging of the head and neck was performed using the standard protocol during bolus administration of intravenous contrast. Multiplanar CT image reconstructions and MIPs were obtained to evaluate the vascular anatomy. Carotid stenosis measurements (when applicable) are obtained utilizing NASCET criteria, using the distal internal  carotid diameter as the denominator. CONTRAST:  75mL OMNIPAQUE IOHEXOL 350 MG/ML SOLN COMPARISON:  None. FINDINGS: CT HEAD FINDINGS Brain: No evidence of acute infarction, hemorrhage, hydrocephalus, extra-axial collection or mass lesion/mass effect. Vascular: No hyperdense vessel or unexpected calcification. 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 Hyperostosis interna. Sinuses: Clear Orbits: Gaze to the right. Review of the MIP images confirms the above findings CTA NECK FINDINGS Aortic arch: No acute finding. Four vessel branching with left vertebral artery arising from the arch Right carotid system: Mild atheromatous plaque at the ICA bulb. ICA tortuosity towards the skull base. No beading or ulceration. Left carotid system: Mild atherosclerosis Vertebral arteries: No proximal subclavian stenosis on the right. The left vertebral artery arises from the arch. Calcified plaque at the left vertebral origin with streak artifact limiting lumen visualization, estimated at mild or moderate. Skeleton: Cervical spine degeneration with mild C3-4 anterolisthesis. Other neck: No acute finding. Upper chest: Negative Review of the MIP images confirms the above findings CTA HEAD FINDINGS Anterior circulation: Plaque along the carotid siphons. No branch occlusion, flow limiting stenosis, beading, or aneurysm. Posterior circulation: The vertebral and basilar arteries are smooth and widely patent. Hypoplastic right P1 segment with fetal type PCA flow. No branch occlusion, beading, or aneurysm. Venous sinuses: Patent Anatomic variants: Early  branching MCA on the left. Critical Value/emergent results were called by telephone at the time of interpretation on 09/29/2020 at 7:11 am to [facility] , who verbally acknowledged these results. Review of the MIP images confirms the above findings IMPRESSION: Head CT: No hemorrhage or visible infarct. CTA: 1. No emergent finding including large vessel occlusion. 2. Atherosclerosis without flow limiting stenosis of major vessels. Electronically Signed   By: Marnee SpringJonathon  Watts M.D.   On: 09/29/2020 07:16   MR BRAIN WO CONTRAST  Result Date: 09/29/2020 CLINICAL DATA:  Acute neuro deficit.  Altered mental status EXAM: MRI HEAD WITHOUT CONTRAST TECHNIQUE: Multiplanar, multiecho pulse sequences of the brain and surrounding structures were obtained without intravenous contrast. COMPARISON:  CT angio head neck 09/29/2020 FINDINGS: Brain: No acute infarction, hemorrhage, hydrocephalus, extra-axial collection or mass lesion. Scattered small deep white matter hyperintensities bilaterally. Brainstem and cerebellum normal. Vascular: Normal arterial flow voids. Skull and upper cervical spine: Negative Sinuses/Orbits: Mild mucosal edema paranasal sinuses. Negative orbit Other: None IMPRESSION: No acute abnormality. Mild white matter changes most consistent with chronic microvascular ischemia. Electronically Signed   By: Marlan Palauharles  Clark M.D.   On: 09/29/2020 14:01   DG Chest Portable 1 View  Result Date: 09/29/2020 CLINICAL DATA:  Confusion, weakness and altered mental status EXAM: PORTABLE CHEST 1 VIEW COMPARISON:  February 19, 2020 FINDINGS: Trachea midline. Cardiomediastinal contours are normal accounting for low lung volumes seen on today's exam. Subtle RIGHT lung base opacity above the RIGHT hemidiaphragm. No sign of effusion. On limited assessment skeletal structures without acute process. IMPRESSION: Low volume chest with subtle RIGHT basilar opacity likely atelectasis. Electronically Signed   By: Donzetta KohutGeoffrey   Wile M.D.   On: 09/29/2020 07:54   ECHOCARDIOGRAM COMPLETE  Result Date: 09/29/2020    ECHOCARDIOGRAM REPORT   [facility]   [facility] Date of Exam: 09/29/2020 Medical Rec #:  [id number]   Height:       66.0 in Accession #:    [id number]  Weight:       219.6 lb Date of Birth:  February 25, 1946   BSA:          2.081 m Patient Age:    [age]  years    BP:           125/112 mmHg Patient Gender: F           HR:           72 bpm. Exam Location:  [facility] Procedure: 2D Echo, Color Doppler and Cardiac Doppler Indications:     I163.9 Stroke  History:         Patient has no prior history of Echocardiogram examinations. No                  medical history.  Sonographer:     Humphrey Rolls RDCS (AE) Referring Phys:  [id number] Lucile Shutters Diagnosing Phys: Julien Nordmann MD  Sonographer Comments: Suboptimal subcostal window. IMPRESSIONS  1. Left ventricular ejection fraction, by estimation, is 30 %. The left ventricle has moderate to severly decreased function. The left ventricle demonstrates global hypokinesis/apical ballooning, with basal regions best preserved consistent with stress cardiomyopathy. Left ventricular diastolic parameters are consistent with Grade I diastolic dysfunction (impaired relaxation).  2. Right ventricular systolic function is normal. The right ventricular size is normal. There is mildly elevated pulmonary artery systolic pressure. The estimated right ventricular systolic pressure is 39.8 mmHg.  3. Mild mitral valve regurgitation. FINDINGS  Left Ventricle: Left ventricular ejection fraction, by estimation, is 30 to 35%. The left ventricle has moderately decreased function. The left ventricle demonstrates global hypokinesis. The left ventricular internal cavity size was normal in size. There is no left ventricular hypertrophy. Left ventricular diastolic parameters are consistent with Grade I diastolic dysfunction (impaired relaxation). Right Ventricle: The right ventricular size is normal. No increase in right  ventricular wall thickness. Right ventricular systolic function is normal. There is mildly elevated pulmonary artery systolic pressure. The tricuspid regurgitant velocity is 2.95  m/s, and with an assumed right atrial pressure of 5 mmHg, the estimated right ventricular systolic pressure is 39.8 mmHg. Left Atrium: Left atrial size was normal in size. Right Atrium: Right atrial size was normal in size. Pericardium: There is no evidence of pericardial effusion. Mitral Valve: The mitral valve is normal in structure. Mild mitral valve regurgitation. No evidence of mitral valve stenosis. MV peak gradient, 3.6 mmHg. The mean mitral valve gradient is 1.0 mmHg. Tricuspid Valve: The tricuspid valve is normal in structure. Tricuspid valve regurgitation is mild . No evidence of tricuspid stenosis. Aortic Valve: The aortic valve is normal in structure. Aortic valve regurgitation is not visualized. No aortic stenosis is present. Aortic valve mean gradient measures 3.0 mmHg. Aortic valve peak gradient measures 4.8 mmHg. Aortic valve area, by VTI measures 1.79 cm. Pulmonic Valve: The pulmonic valve was normal in structure. Pulmonic valve regurgitation is not visualized. No evidence of pulmonic stenosis. Aorta: The aortic root is normal in size and structure. Venous: The inferior vena cava is normal in size with greater than 50% respiratory variability, suggesting right atrial pressure of 3 mmHg. IAS/Shunts: No atrial level shunt detected by color flow Doppler.  LEFT VENTRICLE PLAX 2D LVIDd:         5.01 cm  Diastology LVIDs:         2.73 cm  LV e' medial:    3.37 cm/s LV PW:         0.87 cm  LV E/e' medial:  10.9 LV IVS:        0.81 cm  LV e' lateral:   3.15 cm/s LVOT diam:     2.10 cm  LV E/e' lateral: 11.7 LV SV:  37 LV SV Index:   18 LVOT Area:     3.46 cm  RIGHT VENTRICLE RV Basal diam:  3.70 cm LEFT ATRIUM             Index       RIGHT ATRIUM          Index LA diam:        3.30 cm 1.59 cm/m  RA Area:     9.73 cm LA  Vol (A2C):   36.3 ml 17.45 ml/m RA Volume:   18.40 ml 8.84 ml/m LA Vol (A4C):   21.7 ml 10.43 ml/m LA Biplane Vol: 30.7 ml 14.75 ml/m  AORTIC VALVE                   PULMONIC VALVE AV Area (Vmax):    1.77 cm    PV Vmax:       0.97 m/s AV Area (Vmean):   1.82 cm    PV Vmean:      65.500 cm/s AV Area (VTI):     1.79 cm    PV VTI:        0.197 m AV Vmax:           110.00 cm/s PV Peak grad:  3.8 mmHg AV Vmean:          78.300 cm/s PV Mean grad:  2.0 mmHg AV VTI:            0.207 m AV Peak Grad:      4.8 mmHg AV Mean Grad:      3.0 mmHg LVOT Vmax:         56.10 cm/s LVOT Vmean:        41.100 cm/s LVOT VTI:          0.107 m LVOT/AV VTI ratio: 0.52  AORTA Ao Root diam: 2.90 cm MITRAL VALVE               TRICUSPID VALVE MV Area (PHT): 7.66 cm    TR Peak grad:   34.8 mmHg MV Peak grad:  3.6 mmHg    TR Vmax:        295.00 cm/s MV Mean grad:  1.0 mmHg MV Vmax:       0.95 m/s    SHUNTS MV Vmean:      48.8 cm/s   Systemic VTI:  0.11 m MV Decel Time: 99 msec     Systemic Diam: 2.10 cm MV E velocity: 36.80 cm/s MV A velocity: 96.80 cm/s MV E/A ratio:  0.38 Julien Nordmann MD Electronically signed by Julien Nordmann MD Signature Date/Time: 09/29/2020/2:52:45 PM    Final                LOS: 0 days   Alva Broxson  [facility]   Pager on [email or web address]. If 7PM-7AM, please contact night-coverage at [email or web address]     09/30/2020, 4:08 PM

## 2020-09-30 NOTE — Progress Notes (Signed)
Patient has a critical troponin of 7,421. MD notified.

## 2020-10-01 LAB — CBC
HCT: 34.5 % — ABNORMAL LOW (ref 36.0–46.0)
Hemoglobin: 11.8 g/dL — ABNORMAL LOW (ref 12.0–15.0)
MCH: 29.5 pg (ref 26.0–34.0)
MCHC: 34.2 g/dL (ref 30.0–36.0)
MCV: 86.3 fL (ref 80.0–100.0)
Platelets: 216 10*3/uL (ref 150–400)
RBC: 4 MIL/uL (ref 3.87–5.11)
RDW: 12.2 % (ref 11.5–15.5)
WBC: 8.6 10*3/uL (ref 4.0–10.5)
nRBC: 0 % (ref 0.0–0.2)

## 2020-10-01 LAB — BASIC METABOLIC PANEL
Anion gap: 8 (ref 5–15)
BUN: 10 mg/dL (ref 8–23)
CO2: 26 mmol/L (ref 22–32)
Calcium: 9.1 mg/dL (ref 8.9–10.3)
Chloride: 101 mmol/L (ref 98–111)
Creatinine, Ser: 1.01 mg/dL — ABNORMAL HIGH (ref 0.44–1.00)
GFR calc Af Amer: >60 mL/min (ref >60)
GFR calc non Af Amer: 55 mL/min — ABNORMAL LOW (ref >60)
Glucose, Bld: 115 mg/dL — ABNORMAL HIGH (ref 70–99)
Potassium: 3.9 mmol/L (ref 3.5–5.1)
Sodium: 135 mmol/L (ref 135–145)

## 2020-10-01 LAB — HEPARIN LEVEL (UNFRACTIONATED)
Heparin Unfractionated: 0.11 IU/mL — ABNORMAL LOW (ref 0.30–0.70)
Heparin Unfractionated: 0.27 IU/mL — ABNORMAL LOW (ref 0.30–0.70)
Heparin Unfractionated: 0.42 IU/mL (ref 0.30–0.70)

## 2020-10-01 MED ORDER — ASPIRIN 81 MG PO CHEW
81.0000 mg | CHEWABLE_TABLET | ORAL | Status: AC
  Administered 2020-10-02: 81 mg via ORAL
  Filled 2020-10-01: qty 1

## 2020-10-01 MED ORDER — ROSUVASTATIN CALCIUM 10 MG PO TABS
40.0000 mg | ORAL_TABLET | Freq: Every day | ORAL | Status: DC
  Administered 2020-10-01 – 2020-10-03 (×3): 40 mg via ORAL
  Filled 2020-10-01 (×3): qty 4

## 2020-10-01 MED ORDER — HEPARIN BOLUS VIA INFUSION
1000.0000 [IU] | Freq: Once | INTRAVENOUS | Status: AC
  Administered 2020-10-01: 1000 [IU] via INTRAVENOUS
  Filled 2020-10-01: qty 1000

## 2020-10-01 MED ORDER — SODIUM CHLORIDE 0.9% FLUSH
3.0000 mL | Freq: Two times a day (BID) | INTRAVENOUS | Status: DC
  Administered 2020-10-02 – 2020-10-03 (×3): 3 mL via INTRAVENOUS

## 2020-10-01 MED ORDER — SODIUM CHLORIDE 0.9% FLUSH: 3.0000 mL | INTRAVENOUS | Status: DC | PRN

## 2020-10-01 MED ORDER — SODIUM CHLORIDE 0.9 % IV SOLN: 250.0000 mL | INTRAVENOUS | Status: DC | PRN

## 2020-10-01 MED ORDER — SODIUM CHLORIDE 0.9 % IV SOLN: INTRAVENOUS | Status: DC

## 2020-10-01 NOTE — Progress Notes (Signed)
T/C to [facility]. Update given to [facility] regarding the patient.

## 2020-10-01 NOTE — Progress Notes (Signed)
[facility]    SUBJECTIVE:Patient states to be doing reasonable well.Denies any chest pain sob palpitations or tachycardia   Vitals:   09/30/20 1957 09/30/20 2125 09/30/20 2234 10/01/20 0728  BP: 101/67 99/66 104/66 107/79  Pulse: 80 78 79 81  Resp: 16 18  12   Temp: 97.7 F (36.5 C) 98.4 F (36.9 C)  98.3 F (36.8 C)  TempSrc:  Oral  Oral  SpO2: 93% 96% 97% 98%  Weight:      Height:         Intake/Output Summary (Last 24 hours) at 10/01/2020 0936 Last data filed at 10/01/2020 0636 Gross per 24 hour  Intake 1279.79 ml  Output 525 ml  Net 754.79 ml      PHYSICAL EXAM  General: Well developed, well nourished, in no acute distress HEENT:  Normocephalic and atramatic Neck:  No JVD.  Lungs: Clear bilaterally to auscultation and percussion. Heart: HRRR . Normal S1 and S2 without gallops or murmurs.  Abdomen: Bowel sounds are positive, abdomen soft and non-tender  Msk:  Back normal, normal gait. Normal strength and tone for age. Extremities: No clubbing, cyanosis or edema.   Neuro: Alert and oriented X 3. Psych:  Good affect, responds appropriately   LABS: Basic Metabolic Panel: Recent Labs    09/30/20 0502 10/01/20 0019  NA 133* 135  K 4.5 3.9  CL 100 101  CO2 24 26  GLUCOSE 102* 115*  BUN 7* 10  CREATININE 0.95 1.01*  CALCIUM 9.5 9.1  MG 1.4*  --    Liver Function Tests: Recent Labs    09/29/20 0635 09/30/20 0502  AST 28 47*  ALT 17 18  ALKPHOS 48 55  BILITOT 0.9 0.9  PROT 6.9 6.6  ALBUMIN 4.4 4.2   No results for input(s): LIPASE, AMYLASE in the last 72 hours. CBC: Recent Labs    09/29/20 0635 10/01/20 0019  WBC 8.0 8.6  NEUTROABS 5.0  --   HGB 12.2 11.8*  HCT 34.2* 34.5*  MCV 84.0 86.3  PLT 253 216   Cardiac Enzymes: No results for input(s): CKTOTAL, CKMB, CKMBINDEX, TROPONINI in the last 72 hours. BNP: Invalid input(s): POCBNP D-Dimer: No results for input(s): DDIMER in the last 72 hours. Hemoglobin A1C: Recent Labs     09/30/20 0502  HGBA1C 5.6   Fasting Lipid Panel: Recent Labs    09/30/20 1513  CHOL 242*  HDL 59  LDLCALC 166*  TRIG 84  CHOLHDL 4.1   Thyroid Function Tests: No results for input(s): TSH, T4TOTAL, T3FREE, THYROIDAB in the last 72 hours.  Invalid input(s): FREET3 Anemia Panel: No results for input(s): VITAMINB12, FOLATE, FERRITIN, TIBC, IRON, RETICCTPCT in the last 72 hours.  EEG  Result Date: 09/29/2020 Caroline Miller, Caroline O, MD     09/29/2020  5:28 PM Patient Name: Caroline Miller MRN: [id number] Epilepsy Attending: Charlsie QuestPriyanka Miller Yadav Referring Physician/Provider: Dr Caroline Miller Date: 09/29/2020 Duration: 21.51 mins Patient history: 74 y.Miller. female with no history of seizures per the patient who his been having some trouble with her speech since she woke up yesterday morning (per patient). EEG to evaluate for seizure. Level of alertness: Awake, asleep AEDs during EEG study: LEV Technical aspects: This EEG study was done with scalp electrodes positioned according to the 10-20 International system of electrode placement. Electrical activity was acquired at a sampling rate of 500Hz  and reviewed with a high frequency filter of 70Hz  and a low frequency filter of 1Hz . EEG data were recorded continuously and digitally stored.  Description: No posterior dominant rhythm was seen. Sleep was characterized by sleep spindles (12 to 14 Hz), maximal frontocentral region. EEG showed continuous generalized 3 to 6 Hz theta-delta slowing. Physiologic photic driving was not seen during photic stimulation.  Hyperventilation was not performed.   ABNORMALITY -Continuous slow, generalized IMPRESSION: This study is suggestive of moderate diffuse encephalopathy, nonspecific etiology. No seizures or epileptiform discharges were seen throughout the recording. Caroline Quest   DG Abd 1 View  Result Date: 09/29/2020 CLINICAL DATA:  Evaluate for metallic foreign body prior to MRI EXAM: ABDOMEN - 1 VIEW COMPARISON:  None.  FINDINGS: The bowel gas pattern is normal. No radio-opaque calculi or other significant radiographic abnormality are seen. No metallic radiopaque foreign body is identified within the abdomen. IMPRESSION: 1. No metallic radiopaque foreign body identified within the abdomen. 2. Nonobstructive bowel gas pattern. Electronically Signed   By: Caroline Miller D.Miller.   On: 09/29/2020 12:36   MR BRAIN WO CONTRAST  Result Date: 09/29/2020 CLINICAL DATA:  Acute neuro deficit.  Altered mental status EXAM: MRI HEAD WITHOUT CONTRAST TECHNIQUE: Multiplanar, multiecho pulse sequences of the brain and surrounding structures were obtained without intravenous contrast. COMPARISON:  CT angio head neck 09/29/2020 FINDINGS: Brain: No acute infarction, hemorrhage, hydrocephalus, extra-axial collection or mass lesion. Scattered small deep white matter hyperintensities bilaterally. Brainstem and cerebellum normal. Vascular: Normal arterial flow voids. Skull and upper cervical spine: Negative Sinuses/Orbits: Mild mucosal edema paranasal sinuses. Negative orbit Other: None IMPRESSION: No acute abnormality. Mild white matter changes most consistent with chronic microvascular ischemia. Electronically Signed   By: Caroline Miller M.D.   On: 09/29/2020 14:01   ECHOCARDIOGRAM COMPLETE  Result Date: 09/29/2020    ECHOCARDIOGRAM REPORT   Patient Name:   Caroline Miller Date of Exam: 09/29/2020 Medical Rec #:  [id number]   Height:       66.0 in Accession #:    [id number]  Weight:       219.6 lb Date of Birth:  Apr 16, 1946   BSA:          2.081 m Patient Age:    74 years    BP:           125/112 mmHg Patient Gender: F           HR:           72 bpm. Exam Location:  [facility] Procedure: 2D Echo, Color Doppler and Cardiac Doppler Indications:     I163.9 Stroke  History:         Patient has no prior history of Echocardiogram examinations. No                  medical history.  Sonographer:     Caroline Miller RDCS (AE) Referring Phys:  [id number] Caroline Miller  Diagnosing Phys: Julien Nordmann MD  Sonographer Comments: Suboptimal subcostal window. IMPRESSIONS  1. Left ventricular ejection fraction, by estimation, is 30 %. The left ventricle has moderate to severly decreased function. The left ventricle demonstrates global hypokinesis/apical ballooning, with basal regions best preserved consistent with stress cardiomyopathy. Left ventricular diastolic parameters are consistent with Grade I diastolic dysfunction (impaired relaxation).  2. Right ventricular systolic function is normal. The right ventricular size is normal. There is mildly elevated pulmonary artery systolic pressure. The estimated right ventricular systolic pressure is 39.8 mmHg.  3. Mild mitral valve regurgitation. FINDINGS  Left Ventricle: Left ventricular ejection fraction, by estimation, is 30 to 35%. The left ventricle has moderately decreased function. The  left ventricle demonstrates global hypokinesis. The left ventricular internal cavity size was normal in size. There is no left ventricular hypertrophy. Left ventricular diastolic parameters are consistent with Grade I diastolic dysfunction (impaired relaxation). Right Ventricle: The right ventricular size is normal. No increase in right ventricular wall thickness. Right ventricular systolic function is normal. There is mildly elevated pulmonary artery systolic pressure. The tricuspid regurgitant velocity is 2.95  m/s, and with an assumed right atrial pressure of 5 mmHg, the estimated right ventricular systolic pressure is 39.8 mmHg. Left Atrium: Left atrial size was normal in size. Right Atrium: Right atrial size was normal in size. Pericardium: There is no evidence of pericardial effusion. Mitral Valve: The mitral valve is normal in structure. Mild mitral valve regurgitation. No evidence of mitral valve stenosis. MV peak gradient, 3.6 mmHg. The mean mitral valve gradient is 1.0 mmHg. Tricuspid Valve: The tricuspid valve is normal in structure.  Tricuspid valve regurgitation is mild . No evidence of tricuspid stenosis. Aortic Valve: The aortic valve is normal in structure. Aortic valve regurgitation is not visualized. No aortic stenosis is present. Aortic valve mean gradient measures 3.0 mmHg. Aortic valve peak gradient measures 4.8 mmHg. Aortic valve area, by VTI measures 1.79 cm. Pulmonic Valve: The pulmonic valve was normal in structure. Pulmonic valve regurgitation is not visualized. No evidence of pulmonic stenosis. Aorta: The aortic root is normal in size and structure. Venous: The inferior vena cava is normal in size with greater than 50% respiratory variability, suggesting right atrial pressure of 3 mmHg. IAS/Shunts: No atrial level shunt detected by color flow Doppler.  LEFT VENTRICLE PLAX 2D LVIDd:         5.01 cm  Diastology LVIDs:         2.73 cm  LV e' medial:    3.37 cm/s LV PW:         0.87 cm  LV E/e' medial:  10.9 LV IVS:        0.81 cm  LV e' lateral:   3.15 cm/s LVOT diam:     2.10 cm  LV E/e' lateral: 11.7 LV SV:         37 LV SV Index:   18 LVOT Area:     3.46 cm  RIGHT VENTRICLE RV Basal diam:  3.70 cm LEFT ATRIUM             Index       RIGHT ATRIUM          Index LA diam:        3.30 cm 1.59 cm/m  RA Area:     9.73 cm LA Vol (A2C):   36.3 ml 17.45 ml/m RA Volume:   18.40 ml 8.84 ml/m LA Vol (A4C):   21.7 ml 10.43 ml/m LA Biplane Vol: 30.7 ml 14.75 ml/m  AORTIC VALVE                   PULMONIC VALVE AV Area (Vmax):    1.77 cm    PV Vmax:       0.97 m/s AV Area (Vmean):   1.82 cm    PV Vmean:      65.500 cm/s AV Area (VTI):     1.79 cm    PV VTI:        0.197 m AV Vmax:           110.00 cm/s PV Peak grad:  3.8 mmHg AV Vmean:  78.300 cm/s PV Mean grad:  2.0 mmHg AV VTI:            0.207 m AV Peak Grad:      4.8 mmHg AV Mean Grad:      3.0 mmHg LVOT Vmax:         56.10 cm/s LVOT Vmean:        41.100 cm/s LVOT VTI:          0.107 m LVOT/AV VTI ratio: 0.52  AORTA Ao Root diam: 2.90 cm MITRAL VALVE               TRICUSPID  VALVE MV Area (PHT): 7.66 cm    TR Peak grad:   34.8 mmHg MV Peak grad:  3.6 mmHg    TR Vmax:        295.00 cm/s MV Mean grad:  1.0 mmHg MV Vmax:       0.95 m/s    SHUNTS MV Vmean:      48.8 cm/s   Systemic VTI:  0.11 m MV Decel Time: 99 msec     Systemic Diam: 2.10 cm MV E velocity: 36.80 cm/s MV A velocity: 96.80 cm/s MV E/A ratio:  0.38 Julien Nordmann MD Electronically signed by Julien Nordmann MD Signature Date/Time: 09/29/2020/2:52:45 PM    Final      Echo Severely reduced LVF globally EF<25%  TELEMETRY: NSR 90 nsstw changes  ASSESSMENT AND PLAN:  Principal Problem:   Seizure (HCC) Active Problems:   Hyponatremia   Hypokalemia   Stress-induced cardiomyopathy   NSTEMI (non-ST elevated myocardial infarction) (HCC)   Hypomagnesemia    Plan Agree with tele and f/u Ekg and troponins Continue Bp control Maintain heart failure therapy Entresto Coreg and lasix Consider cardiac cath in 24-48 hrs Refer to heart failure clininc   Alwyn Pea, MD 10/01/2020 9:36 AM

## 2020-10-01 NOTE — Progress Notes (Signed)
ANTICOAGULATION CONSULT NOTE   Pharmacy Consult for heparin Indication: chest pain/ACS  Allergies  Allergen Reactions  . Sulfa Antibiotics   . Sulfur Swelling and Rash    Patient Measurements: Height: 5\' 2"  (157.5 cm) Weight: 91.9 kg (202 lb 9.6 oz) IBW/kg (Calculated) : 50.1 Heparin Dosing Weight: 71.4 kg  Vital Signs: Temp: 98.3 F (36.8 C) (10/03 0728) Temp Source: Oral (10/03 0728) BP: 107/79 (10/03 0728) Pulse Rate: 81 (10/03 0728)  Labs: Recent Labs    09/29/20 0635 09/30/20 0502 09/30/20 1513 10/01/20 0019 10/01/20 1021  HGB 12.2  --   --  11.8*  --   HCT 34.2*  --   --  34.5*  --   PLT 253  --   --  216  --   APTT 25  --   --   --   --   LABPROT 12.0  --   --   --   --   INR 0.9  --   --   --   --   HEPARINUNFRC  --   --   --  0.11* 0.27*  CREATININE 1.18* 0.95  --  1.01*  --   TROPONINIHS  --  7,421* 6,485*  --   --     Estimated Creatinine Clearance: 52.3 mL/min (A) (by C-G formula based on SCr of 1.01 mg/dL (H)).  Assessment: 74 y.o. female presenting with seizure and found to have minimal ST elevation on EKG, no chest pain, elevated troponins. Pharmacy has been consulted for IV heparin dosing while cardiac workup in progress.  No prior to admission anticoagulation noted. Hgb and platelets WNL. No overt bleeding noted.   Heparin bolus of 4000 units x 1 Then start heparin infusion at 900 units/hr Check 6 hour heparin level Monitor CBC, daily heparin level  Continue to monitor for signs/symptoms of bleeding F/u transition to oral anticoagulant or antiplatelet agents as appropriate  1003 0019 HL 0.11, SUBtherapeutic.  CBC stable.  Will rebolus w/ 1000 units x 1 and increase infusion to 1100 units/hr.   Goal of Therapy:  Heparin level 0.3-0.7 units/ml Monitor platelets by anticoagulation protocol: Yes  Plan:  1003 @1021 ,  HL 0.27, SUBtherapeutic.  CBC stable.  Will rebolus w/ 1000 units x 1 and increase infusion to 1250 units/hr.  Recheck HL ~ 8  hours after increase  Monitor CBC, daily heparin level  Continue to monitor for signs/symptoms of bleeding F/u transition to oral anticoagulant or antiplatelet agents as appropriate   66 PharmD Clinical Pharmacist 10/01/2020

## 2020-10-01 NOTE — Plan of Care (Signed)

## 2020-10-01 NOTE — Progress Notes (Signed)
ANTICOAGULATION CONSULT NOTE - Initial Consult  Pharmacy Consult for heparin Indication: chest pain/ACS  Allergies  Allergen Reactions   Sulfa Antibiotics    Sulfur Swelling and Rash    Patient Measurements: Height: 5\' 2"  (157.5 cm) Weight: 91.9 kg (202 lb 9.6 oz) IBW/kg (Calculated) : 50.1 Heparin Dosing Weight: 71.4 kg  Vital Signs: Temp: 98.4 F (36.9 C) (10/02 2125) Temp Source: Oral (10/02 2125) BP: 104/66 (10/02 2234) Pulse Rate: 79 (10/02 2234)  Labs: Recent Labs    09/29/20 0635 09/30/20 0502 09/30/20 1513 10/01/20 0019  HGB 12.2  --   --  11.8*  HCT 34.2*  --   --  34.5*  PLT 253  --   --  216  APTT 25  --   --   --   LABPROT 12.0  --   --   --   INR 0.9  --   --   --   HEPARINUNFRC  --   --   --  0.11*  CREATININE 1.18* 0.95  --  1.01*  TROPONINIHS  --  7,421* 6,485*  --     Estimated Creatinine Clearance: 52.3 mL/min (A) (by C-G formula based on SCr of 1.01 mg/dL (H)).  Assessment: 74 y.o. female presenting with seizure and found to have minimal ST elevation on EKG, no chest pain, elevated troponins. Pharmacy has been consulted for IV heparin dosing while cardiac workup in progress.  No prior to admission anticoagulation noted. Hgb and platelets WNL. No overt bleeding noted.   Goal of Therapy:  Heparin level 0.3-0.7 units/ml Monitor platelets by anticoagulation protocol: Yes  Plan:  Heparin bolus of 4000 units x 1 Then start heparin infusion at 900 units/hr Check 6 hour heparin level Monitor CBC, daily heparin level  Continue to monitor for signs/symptoms of bleeding F/u transition to oral anticoagulant or antiplatelet agents as appropriate  1003 0019 HL 0.11, SUBtherapeutic.  CBC stable.  Will rebolus w/ 1000 units x 1 and increase infusion to 1100 units/hr.  Recheck HL ~ 8 hours after increase   66, PharmD Clinical Pharmacist  10/01/2020   1:29 AM

## 2020-10-01 NOTE — Progress Notes (Addendum)
PROGRESS NOTE    Caroline Miller  NWG:[id number] DOB: Jan 18, 1946 DOA: 09/29/2020 PCP: Patient, No Pcp Per   Brief Narrative:  Caroline Miller is a 74 y.o. female with medical history significant for hypertension, hyperlipidemia, was initially brought in as a Caroline Miller by a neighbor. She was later identified as Caroline Miller. She was brought to the hospital because of altered mental status. Reportedly, she was unable to speak in full sentences when EMS picked her up. Apparently, she was nonverbal in the ED and possibly had a seizure event while she was under CT scanner table in the radiology unit. She was given IV Ativan for this event.  She was admitted to the hospital for seizure and she was treated with IV Keppra and subsequently switched to oral Keppra. She was evaluated by the neurologist. EEG did not show any epileptiform activity.  As part of the work-up, 2D echo was obtained and there was an incidental finding of low EF of 30 to 35% and findings suggestive of stress-induced cardiomyopathy. Troponin was therefore obtained and this came back elevated at 7,421. Cardiologist was consulted for stress-induced cardiomyopathy and possible NSTEMI. She was started on IV heparin infusion, carvedilol, Entresto and aspirin.  Subjective: Patient was feeling better when seen today.  Per patient couple of days ago she felt sudden onset shortness of breath with some chest pressure, describing as elephant sitting on her chest, followed by dizziness and blurry vision.  She believes that she did pass out transiently with those feelings. Patient denies any prior history of chest pain or CAD.  No prior history of seizure disorder.  Assessment & Plan:   Principal Problem:   Seizure (HCC) Active Problems:   Hyponatremia   Hypokalemia   Stress-induced cardiomyopathy   NSTEMI (non-ST elevated myocardial infarction) (HCC)   Hypomagnesemia  Encephalopathy.  Resolved.  Patient appears to be at baseline. NSTEMI or  seizure can be a possibility.  New onset HFrEF/NSTEMI.  Echocardiogram with EF of 30 to 35%.  Concerning for Takotsubo cardiomyopathy with apical ballooning.  Per patient she do have multiple stresses at home and job. Patient do have atypical chest pain/pressure couple of days ago. Troponin peaked at 7421 which makes it at NSTEMI range. She was started on heparin infusion and cardiology was consulted. At this time they are recommending outpatient ischemic work-up. -We will ask cardiology if they can do cardiac catheterization to rule out any obstruction while she is in hospital. -Continue Entresto. -Start her on Crestor, elevated total cholesterol of 242 and LDL of 166.  Hyperlipidemia. Abnormal lipid profile with elevated total and LDL. -Starting her on Crestor. -Continue Zetia.  Seizure-like activity.  Patient has a witnessed seizure-like activity while in CT scan.  EEG was negative for any epileptic activity.  She was started on Keppra by neurology.  That might be due to transient cerebral ischemia secondary to some cardiac arrhythmia as patient had cardiomyopathy. Discussed with neurology and they are recommending continuation of Keppra as she did had prolonged postictal state. She was advised to follow-up with neurology as an outpatient for further management and to not drive for at least 6 months.  Chronic pain syndrome.  Patient is on opioids as an outpatient. Discussed tapering it down. -Continue with home medications and she will follow up with her primary care provider for further management.  Depression.  No acute concern. -Continue home meds.  Objective: Vitals:   09/30/20 2125 09/30/20 2234 10/01/20 0728 10/01/20 1139  BP: 99/66 104/66 107/79 104/70  Pulse: 78 79 81 82  Resp: 18  12 14   Temp: 98.4 F (36.9 C)  98.3 F (36.8 C) 98.4 F (36.9 C)  TempSrc: Oral  Oral Oral  SpO2: 96% 97% 98% 95%  Weight:      Height:        Intake/Output Summary (Last 24 hours) at  10/01/2020 1437 Last data filed at 10/01/2020 1138 Gross per 24 hour  Intake 1703.27 ml  Output 925 ml  Net 778.27 ml   Filed Weights   09/29/20 0701 09/30/20 0000  Weight: 99.6 kg 91.9 kg    Examination:  General exam: Appears calm and comfortable  Respiratory system: Clear to auscultation. Respiratory effort normal. Cardiovascular system: S1 & S2 heard, RRR. No JVD, murmurs, Gastrointestinal system: Soft, nontender, nondistended, bowel sounds positive. Central nervous system: Alert and oriented. No focal neurological deficits. Extremities: No edema, no cyanosis, pulses intact and symmetrical. Psychiatry: Judgement and insight appear normal.    DVT prophylaxis: Heparin Code Status: Full Family Communication: Discussed with caregiver 11/30/20. Disposition Plan:  Status is: Inpatient  Remains inpatient appropriate because:Inpatient level of care appropriate due to severity of illness   Dispo: The patient is from: Home              Anticipated d/c is to: Home              Anticipated d/c date is: 2 days              Patient currently is not medically stable to d/c.   Consultants:   Cardiology  Neurology  Procedures:  Antimicrobials:   Data Reviewed: I have personally reviewed following labs and imaging studies  CBC: Recent Labs  Lab 09/29/20 0635 10/01/20 0019  WBC 8.0 8.6  NEUTROABS 5.0  --   HGB 12.2 11.8*  HCT 34.2* 34.5*  MCV 84.0 86.3  PLT 253 216   Basic Metabolic Panel: Recent Labs  Lab 09/29/20 0635 09/30/20 0502 10/01/20 0019  NA 129* 133* 135  K 3.4* 4.5 3.9  CL 96* 100 101  CO2 20* 24 26  GLUCOSE 113* 102* 115*  BUN 9 7* 10  CREATININE 1.18* 0.95 1.01*  CALCIUM 9.1 9.5 9.1  MG  --  1.4*  --    GFR: Estimated Creatinine Clearance: 52.3 mL/min (A) (by C-G formula based on SCr of 1.01 mg/dL (H)). Liver Function Tests: Recent Labs  Lab 09/29/20 0635 09/30/20 0502  AST 28 47*  ALT 17 18  ALKPHOS 48 55  BILITOT 0.9 0.9  PROT 6.9 6.6   ALBUMIN 4.4 4.2   No results for input(s): LIPASE, AMYLASE in the last 168 hours. No results for input(s): AMMONIA in the last 168 hours. Coagulation Profile: Recent Labs  Lab 09/29/20 0635  INR 0.9   Cardiac Enzymes: No results for input(s): CKTOTAL, CKMB, CKMBINDEX, TROPONINI in the last 168 hours. BNP (last 3 results) No results for input(s): PROBNP in the last 8760 hours. HbA1C: Recent Labs    09/30/20 0502  HGBA1C 5.6   CBG: Recent Labs  Lab 09/29/20 0637  GLUCAP 130*   Lipid Profile: Recent Labs    09/30/20 1513  CHOL 242*  HDL 59  LDLCALC 166*  TRIG 84  CHOLHDL 4.1   Thyroid Function Tests: No results for input(s): TSH, T4TOTAL, FREET4, T3FREE, THYROIDAB in the last 72 hours. Anemia Panel: No results for input(s): VITAMINB12, FOLATE, FERRITIN, TIBC, IRON, RETICCTPCT in the last 72 hours. Sepsis Labs: No results for input(s):  PROCALCITON, LATICACIDVEN in the last 168 hours.  Recent Results (from the past 240 hour(s))  Respiratory Panel by RT PCR (Flu A&B, Covid) - Nasopharyngeal Swab     Status: None   Collection Time: 09/29/20  8:05 AM   Specimen: Nasopharyngeal Swab  Result Value Ref Range Status   SARS Coronavirus 2 by RT PCR NEGATIVE NEGATIVE Final    Comment: (NOTE) SARS-CoV-2 target nucleic acids are NOT DETECTED.  The SARS-CoV-2 RNA is generally detectable in upper respiratoy specimens during the acute phase of infection. The lowest concentration of SARS-CoV-2 viral copies this assay can detect is 131 copies/mL. A negative result does not preclude SARS-Cov-2 infection and should not be used as the sole basis for treatment or other patient management decisions. A negative result may occur with  improper specimen collection/handling, submission of specimen other than nasopharyngeal swab, presence of viral mutation(s) within the areas targeted by this assay, and inadequate number of viral copies (<131 copies/mL). A negative result must be  combined with clinical observations, patient history, and epidemiological information. The expected result is Negative.  Fact Sheet for Patients:  https://www.moore.com/  Fact Sheet for Healthcare Providers:  https://www.young.biz/  This test is no t yet approved or cleared by the [place] FDA and  has been authorized for detection and/or diagnosis of SARS-CoV-2 by FDA under an Emergency Use Authorization (EUA). This EUA will remain  in effect (meaning this test can be used) for the duration of the COVID-19 declaration under Section 564(b)(1) of the Act, 21 U.S.C. section 360bbb-3(b)(1), unless the authorization is terminated or revoked sooner.     Influenza A by PCR NEGATIVE NEGATIVE Final   Influenza B by PCR NEGATIVE NEGATIVE Final    Comment: (NOTE) The Xpert Xpress SARS-CoV-2/FLU/RSV assay is intended as an aid in  the diagnosis of influenza from Nasopharyngeal swab specimens and  should not be used as a sole basis for treatment. Nasal washings and  aspirates are unacceptable for Xpert Xpress SARS-CoV-2/FLU/RSV  testing.  Fact Sheet for Patients: https://www.moore.com/  Fact Sheet for Healthcare Providers: https://www.young.biz/  This test is not yet approved or cleared by the [place] FDA and  has been authorized for detection and/or diagnosis of SARS-CoV-2 by  FDA under an Emergency Use Authorization (EUA). This EUA will remain  in effect (meaning this test can be used) for the duration of the  Covid-19 declaration under Section 564(b)(1) of the Act, 21  U.S.C. section 360bbb-3(b)(1), unless the authorization is  terminated or revoked. Performed at [facility], [address], [place], [place] [address]      Radiology Studies: EEG  Result Date: 09/29/2020 Caroline Quest, MD     09/29/2020  5:28 PM Patient Name: Caroline Miller MRN: [id number] Epilepsy Attending:  Charlsie Miller Referring Physician/Provider: Dr Ritta Slot Date: 09/29/2020 Duration: 21.51 mins Patient history: 74 y.o. female with no history of seizures per the patient who his been having some trouble with her speech since she woke up yesterday morning (per patient). EEG to evaluate for seizure. Level of alertness: Awake, asleep AEDs during EEG study: LEV Technical aspects: This EEG study was done with scalp electrodes positioned according to the 10-20 International system of electrode placement. Electrical activity was acquired at a sampling rate of 500Hz  and reviewed with a high frequency filter of 70Hz  and a low frequency filter of 1Hz . EEG data were recorded continuously and digitally stored. Description: No posterior dominant rhythm was seen. Sleep was characterized by sleep spindles (  12 to 14 Hz), maximal frontocentral region. EEG showed continuous generalized 3 to 6 Hz theta-delta slowing. Physiologic photic driving was not seen during photic stimulation.  Hyperventilation was not performed.   ABNORMALITY -Continuous slow, generalized IMPRESSION: This study is suggestive of moderate diffuse encephalopathy, nonspecific etiology. No seizures or epileptiform discharges were seen throughout the recording. Caroline Miller    Scheduled Meds:  aspirin  81 mg Oral Daily   carvedilol  3.125 mg Oral BID WC   DULoxetine  30 mg Oral Daily   ezetimibe  10 mg Oral Daily   levETIRAcetam  500 mg Oral BID   pantoprazole  40 mg Oral Daily   sacubitril-valsartan  1 tablet Oral BID   traZODone  150 mg Oral QHS   Continuous Infusions:  heparin 1,250 Units/hr (10/01/20 1350)     LOS: 1 day   Time spent: 35 minutes.  Arnetha CourserSumayya Lenette Rau, MD [facility]  If 7PM-7AM, please contact night-coverage [email or web address]  10/01/2020, 2:37 PM   This record has been created using Conservation officer, historic buildingsDragon voice recognition software. Errors have been sought and corrected,but may not always be located. Such  creation errors do not reflect on the standard of care.

## 2020-10-01 NOTE — Progress Notes (Signed)
ANTICOAGULATION CONSULT NOTE   Pharmacy Consult for heparin Indication: chest pain/ACS  Allergies  Allergen Reactions  . Sulfa Antibiotics   . Sulfur Swelling and Rash    Patient Measurements: Height: 5\' 2"  (157.5 cm) Weight: 91.9 kg (202 lb 9.6 oz) IBW/kg (Calculated) : 50.1 Heparin Dosing Weight: 71.4 kg  Vital Signs: Temp: 98.1 F (36.7 C) (10/03 1518) Temp Source: Oral (10/03 1518) BP: 90/66 (10/03 1518) Pulse Rate: 78 (10/03 1518)  Labs: Recent Labs    09/29/20 0635 09/30/20 0502 09/30/20 1513 10/01/20 0019 10/01/20 1021 10/01/20 1848  HGB 12.2  --   --  11.8*  --   --   HCT 34.2*  --   --  34.5*  --   --   PLT 253  --   --  216  --   --   APTT 25  --   --   --   --   --   LABPROT 12.0  --   --   --   --   --   INR 0.9  --   --   --   --   --   HEPARINUNFRC  --   --   --  0.11* 0.27* 0.42  CREATININE 1.18* 0.95  --  1.01*  --   --   TROPONINIHS  --  7,42112/03/21*  --   --   --     Estimated Creatinine Clearance: 52.3 mL/min (A) (by C-G formula based on SCr of 1.01 mg/dL (H)).  Assessment: 74 y.o. female presenting with seizure and found to have minimal ST elevation on EKG, no chest pain, elevated troponins. Pharmacy has been consulted for IV heparin dosing while cardiac workup in progress.  No prior to admission anticoagulation noted. Hgb and platelets WNL. No overt bleeding noted.   Heparin bolus of 4000 units x 1 Then start heparin infusion at 900 units/hr Check 6 hour heparin level Monitor CBC, daily heparin level  Continue to monitor for signs/symptoms of bleeding F/u transition to oral anticoagulant or antiplatelet agents as appropriate  1003 0019 HL 0.11, SUBtherapeutic.  CBC stable.  Will rebolus w/ 1000 units x 1 and increase infusion to 1100 units/hr.   1003 @1021 ,  HL 0.27, SUBtherapeutic.  CBC stable.  Will rebolus w/ 1000 units x 1 and increase infusion to 1250 units/hr.  Goal of Therapy:  Heparin level 0.3-0.7 units/ml Monitor platelets  by anticoagulation protocol: Yes  Plan:  1003 @ 0.42, therapeutic x 1    Will continue current rate of 1250 units/hour and Recheck HL in ~ 8 hours for confirmation   Monitor CBC, daily heparin level  Continue to monitor for signs/symptoms of bleeding F/u transition to oral anticoagulant or antiplatelet agents as appropriate   66, PharmD, BCPS Clinical Pharmacist 10/01/2020 7:35 PM

## 2020-10-01 NOTE — Progress Notes (Addendum)
Subjective: Patient is markedly improved from 2 days ago when I first evaluated her.  Exam: Vitals:   09/30/20 2234 10/01/20 0728  BP: 104/66 107/79  Pulse: 79 81  Resp:  12  Temp:  98.3 F (36.8 C)  SpO2: 97% 98%   Gen: In bed, NAD Resp: non-labored breathing, no acute distress Abd: soft, nt  Neuro: MS: Awake, alert, oriented and appropriate CN: Pupils equal round and reactive, extraocular movements intact, mildly decreased right nasolabial fold Motor: No drift Sensory: Intact light touch  Creatinine 1.0  Impression: 74 year old female who presented with witnessed seizure in the emergency department.  I suspect that she had an unwitnessed seizure prior to arrival as well as etiology for her presentation.  Given the prolonged postictal state, focal nature of described event, I do think she is at risk of having further seizures.  I discussed the options of starting versus not starting antiepileptic therapy in the setting with the patient, and she would like to proceed with therapy.  Recommendations: 1) continue Keppra 500 mg twice daily 2) outpatient follow-up with neurology 3) patient should not drive for 6 months from most recent seizure. 4) no further recommendations on an inpatient basis, please call with further questions or concerns.  Ritta Slot, MD [facility] [phone]  If 7pm- 7am, please page neurology on call as listed in AMION.

## 2020-10-02 ENCOUNTER — Encounter
Admission: EM | Disposition: A | Discharge status: Home / Self Care | Payer: Self-pay | Source: Home / Self Care | Attending: Internal Medicine

## 2020-10-02 ENCOUNTER — Encounter: Payer: Self-pay | Admitting: Internal Medicine

## 2020-10-02 HISTORY — PX: LEFT HEART CATH AND CORONARY ANGIOGRAPHY: CATH118249

## 2020-10-02 LAB — CBC
HCT: 33.5 % — ABNORMAL LOW (ref 36.0–46.0)
Hemoglobin: 11.8 g/dL — ABNORMAL LOW (ref 12.0–15.0)
MCH: 30 pg (ref 26.0–34.0)
MCHC: 35.2 g/dL (ref 30.0–36.0)
MCV: 85.2 fL (ref 80.0–100.0)
Platelets: 226 10*3/uL (ref 150–400)
RBC: 3.93 MIL/uL (ref 3.87–5.11)
RDW: 12.5 % (ref 11.5–15.5)
WBC: 9.3 10*3/uL (ref 4.0–10.5)
nRBC: 0 % (ref 0.0–0.2)

## 2020-10-02 LAB — BASIC METABOLIC PANEL
Anion gap: 9 (ref 5–15)
BUN: 15 mg/dL (ref 8–23)
CO2: 26 mmol/L (ref 22–32)
Calcium: 9.2 mg/dL (ref 8.9–10.3)
Chloride: 99 mmol/L (ref 98–111)
Creatinine, Ser: 1.22 mg/dL — ABNORMAL HIGH (ref 0.44–1.00)
GFR calc Af Amer: 51 mL/min — ABNORMAL LOW (ref >60)
GFR calc non Af Amer: 44 mL/min — ABNORMAL LOW (ref >60)
Glucose, Bld: 119 mg/dL — ABNORMAL HIGH (ref 70–99)
Potassium: 4.4 mmol/L (ref 3.5–5.1)
Sodium: 134 mmol/L — ABNORMAL LOW (ref 135–145)

## 2020-10-02 LAB — HEPARIN LEVEL (UNFRACTIONATED): Heparin Unfractionated: 0.57 IU/mL (ref 0.30–0.70)

## 2020-10-02 SURGERY — LEFT HEART CATH AND CORONARY ANGIOGRAPHY
Anesthesia: Moderate Sedation

## 2020-10-02 MED ORDER — LABETALOL HCL 5 MG/ML IV SOLN: 10.0000 mg | INTRAVENOUS | Status: AC | PRN

## 2020-10-02 MED ORDER — ACETAMINOPHEN 325 MG PO TABS: 650.0000 mg | ORAL_TABLET | ORAL | Status: DC | PRN

## 2020-10-02 MED ORDER — HEPARIN (PORCINE) IN NACL 1000-0.9 UT/500ML-% IV SOLN
INTRAVENOUS | Status: AC
  Filled 2020-10-02: qty 1000

## 2020-10-02 MED ORDER — FENTANYL CITRATE (PF) 100 MCG/2ML IJ SOLN
INTRAMUSCULAR | Status: DC | PRN
Start: 2020-10-02 — End: 2020-10-02
  Administered 2020-10-02: 25 ug via INTRAVENOUS

## 2020-10-02 MED ORDER — FENTANYL CITRATE (PF) 100 MCG/2ML IJ SOLN
INTRAMUSCULAR | Status: AC
  Filled 2020-10-02: qty 2

## 2020-10-02 MED ORDER — HEPARIN (PORCINE) IN NACL 1000-0.9 UT/500ML-% IV SOLN
INTRAVENOUS | Status: DC | PRN
  Administered 2020-10-02: 1000 mL

## 2020-10-02 MED ORDER — MIDAZOLAM HCL 2 MG/2ML IJ SOLN
INTRAMUSCULAR | Status: DC | PRN
  Administered 2020-10-02: 0.5 mg via INTRAVENOUS

## 2020-10-02 MED ORDER — SODIUM CHLORIDE 0.9% FLUSH
3.0000 mL | Freq: Two times a day (BID) | INTRAVENOUS | Status: DC
  Administered 2020-10-02 – 2020-10-03 (×3): 3 mL via INTRAVENOUS

## 2020-10-02 MED ORDER — SODIUM CHLORIDE 0.9 % IV SOLN: INTRAVENOUS | Status: DC

## 2020-10-02 MED ORDER — SODIUM CHLORIDE 0.9 % IV SOLN: 250.0000 mL | INTRAVENOUS | Status: DC | PRN

## 2020-10-02 MED ORDER — ONDANSETRON HCL 4 MG/2ML IJ SOLN: 4.0000 mg | Freq: Four times a day (QID) | INTRAMUSCULAR | Status: DC | PRN

## 2020-10-02 MED ORDER — SODIUM CHLORIDE 0.9% FLUSH: 3.0000 mL | INTRAVENOUS | Status: DC | PRN

## 2020-10-02 MED ORDER — MIDAZOLAM HCL 2 MG/2ML IJ SOLN
INTRAMUSCULAR | Status: AC
  Filled 2020-10-02: qty 2

## 2020-10-02 MED ORDER — HYDRALAZINE HCL 20 MG/ML IJ SOLN: 10.0000 mg | INTRAMUSCULAR | Status: AC | PRN

## 2020-10-02 SURGICAL SUPPLY — 8 items
CATH INFINITI 5FR JL4 (CATHETERS) ×3 IMPLANT
CATH INFINITI JR4 5F (CATHETERS) ×3 IMPLANT
DEVICE CLOSURE MYNXGRIP 5F (Vascular Products) ×3 IMPLANT
KIT MANI 3VAL PERCEP (MISCELLANEOUS) ×3 IMPLANT
NEEDLE PERC 18GX7CM (NEEDLE) ×3 IMPLANT
PACK CARDIAC CATH (CUSTOM PROCEDURE TRAY) ×3 IMPLANT
SHEATH AVANTI 5FR X 11CM (SHEATH) ×3 IMPLANT
WIRE GUIDERIGHT .035X150 (WIRE) ×3 IMPLANT

## 2020-10-02 NOTE — Progress Notes (Addendum)
ANTICOAGULATION CONSULT NOTE   Pharmacy Consult for heparin Indication: chest pain/ACS  Allergies  Allergen Reactions  . Sulfa Antibiotics   . Sulfur Swelling and Rash    Patient Measurements: Height: 5\' 2"  (157.5 cm) Weight: 91.9 kg (202 lb 9.6 oz) IBW/kg (Calculated) : 50.1 Heparin Dosing Weight: 71.4 kg  Vital Signs: Temp: 98.2 F (36.8 C) (10/03 1943) Temp Source: Oral (10/03 1943) BP: 85/65 (10/03 1943) Pulse Rate: 73 (10/03 1943)  Labs: Recent Labs    09/29/20 0635 09/29/20 0635 09/30/20 0502 09/30/20 1513 10/01/20 0019 10/01/20 0019 10/01/20 1021 10/01/20 1848 10/02/20 0421  HGB 12.2   < >  --   --  11.8*  --   --   --  11.8*  HCT 34.2*  --   --   --  34.5*  --   --   --  33.5*  PLT 253  --   --   --  216  --   --   --  226  APTT 25  --   --   --   --   --   --   --   --   LABPROT 12.0  --   --   --   --   --   --   --   --   INR 0.9  --   --   --   --   --   --   --   --   HEPARINUNFRC  --   --   --   --  0.11*   < > 0.27* 0.42 0.57  CREATININE 1.18*  --  0.95  --  1.01*  --   --   --   --   TROPONINIHS  --   --  7,421* 6,485*  --   --   --   --   --    < > = values in this interval not displayed.    Estimated Creatinine Clearance: 52.3 mL/min (A) (by C-G formula based on SCr of 1.01 mg/dL (H)).  Assessment: 74 y.o. female presenting with seizure and found to have minimal ST elevation on EKG, no chest pain, elevated troponins. Pharmacy has been consulted for IV heparin dosing while cardiac workup in progress.  No prior to admission anticoagulation noted. Hgb and platelets WNL. No overt bleeding noted.   Heparin bolus of 4000 units x 1 Then start heparin infusion at 900 units/hr Check 6 hour heparin level Monitor CBC, daily heparin level  Continue to monitor for signs/symptoms of bleeding F/u transition to oral anticoagulant or antiplatelet agents as appropriate  1003 0019 HL 0.11, SUBtherapeutic.  CBC stable.  Will rebolus w/ 1000 units x 1 and  increase infusion to 1100 units/hr.   1003 @1021 ,  HL 0.27, SUBtherapeutic.  CBC stable.  Will rebolus w/ 1000 units x 1 and increase infusion to 1250 units/hr.  Goal of Therapy:  Heparin level 0.3-0.7 units/ml Monitor platelets by anticoagulation protocol: Yes  Plan:  1003 @ 0.42, therapeutic x 1   1004 0421 HL 0.57, therapeutic x 2, CBC stable.  Will continue current rate of 1250 units/hour.  Monitor CBC, daily heparin level  Continue to monitor for signs/symptoms of bleeding F/u transition to oral anticoagulant or antiplatelet agents as appropriate   66, PharmD Clinical Pharmacist 10/02/2020 4:59 AM

## 2020-10-02 NOTE — Progress Notes (Signed)
[facility] Cardiology    SUBJECTIVE: Patient states to be doing reasonably well status post cardiac cath which suggested Takotsubo.  This is not a typical non-STEMI there is no coronary disease patient denies any symptoms currently feels fine feels like she is ready to go home.  No shortness of breath currently no palpitations or tachycardia   Vitals:   10/02/20 0845 10/02/20 0900 10/02/20 0927 10/02/20 1210  BP: 94/64 95/78 99/67  101/66  Pulse: 68 65 72 71  Resp: 11 17 16 16   Temp:   97.6 F (36.4 C) 98 F (36.7 C)  TempSrc:   Oral Oral  SpO2: 94% 94% 96% 97%  Weight:      Height:         Intake/Output Summary (Last 24 hours) at 10/02/2020 1330 Last data filed at 10/02/2020 1034 Gross per 24 hour  Intake 792.1 ml  Output 1300 ml  Net -507.9 ml      PHYSICAL EXAM  General: Well developed, well nourished, in no acute distress HEENT:  Normocephalic and atramatic Neck:  No JVD.  Lungs: Clear bilaterally to auscultation and percussion. Heart: HRRR . Normal S1 and S2 without gallops or murmurs.  Abdomen: Bowel sounds are positive, abdomen soft and non-tender  Msk:  Back normal, normal gait. Normal strength and tone for age. Extremities: No clubbing, cyanosis or edema.   Neuro: Alert and oriented X 3. Psych:  Good affect, responds appropriately   LABS: Basic Metabolic Panel: Recent Labs    09/30/20 0502 09/30/20 0502 10/01/20 0019 10/02/20 0421  NA 133*   < > 135 134*  K 4.5   < > 3.9 4.4  CL 100   < > 101 99  CO2 24   < > 26 26  GLUCOSE 102*   < > 115* 119*  BUN 7*   < > 10 15  CREATININE 0.95   < > 1.01* 1.22*  CALCIUM 9.5   < > 9.1 9.2  MG 1.4*  --   --   --    < > = values in this interval not displayed.   Liver Function Tests: Recent Labs    09/30/20 0502  AST 47*  ALT 18  ALKPHOS 55  BILITOT 0.9  PROT 6.6  ALBUMIN 4.2   No results for input(s): LIPASE, AMYLASE in the last 72 hours. CBC: Recent Labs    10/01/20 0019 10/02/20 0421  WBC 8.6 9.3    HGB 11.8* 11.8*  HCT 34.5* 33.5*  MCV 86.3 85.2  PLT 216 226   Cardiac Enzymes: No results for input(s): CKTOTAL, CKMB, CKMBINDEX, TROPONINI in the last 72 hours. BNP: Invalid input(s): POCBNP D-Dimer: No results for input(s): DDIMER in the last 72 hours. Hemoglobin A1C: Recent Labs    09/30/20 0502  HGBA1C 5.6   Fasting Lipid Panel: Recent Labs    09/30/20 1513  CHOL 242*  HDL 59  LDLCALC 166*  TRIG 84  CHOLHDL 4.1   Thyroid Function Tests: No results for input(s): TSH, T4TOTAL, T3FREE, THYROIDAB in the last 72 hours.  Invalid input(s): FREET3 Anemia Panel: No results for input(s): VITAMINB12, FOLATE, FERRITIN, TIBC, IRON, RETICCTPCT in the last 72 hours.  CARDIAC CATHETERIZATION  Result Date: 10/02/2020  Normal coronaries  Takotsubo morphology on left ventriculogram  Severely depressed left ventricular function anterior apical ballooning pattern EF of 25%  Conclusion Cardiac cath because of elevated troponin Normal coronaries Severely depressed left ventricular function with a Takotsubo pattern EF less than 25% Recommend conservative medical  therapy for cardiomyopathy and possible heart failure Minx deployed     Echo severely depressed left ventricular function anterior apical akinesis ballooning suggestive of Takotsubo  TELEMETRY: Normal sinus rhythm nonspecific reticular changes rate of 65:  ASSESSMENT AND PLAN:  Principal Problem:   Seizure (HCC) Active Problems:   Hyponatremia   Hypokalemia   Stress-induced cardiomyopathy   NSTEMI (non-ST elevated myocardial infarction) (HCC)   Hypomagnesemia Takotsubo   Plan  status post cardiac cath with no significant obstructive disease severely depressed ballooning apical LV dysfunction EF is severely depressed suggestive of Takotsubo recommend medical therapy for cardiomyopathy Recommend Entresto Coreg diuretics as necessary and supportive care Statin therapy may be helpful Continue hypertension management  primarily with Entresto and Coreg can also add spironolactone This does not represent a typical non-STEMI though heart troponins are elevated it is more of a demand stress ischemia related to some other underlying event possibly the questionable seizure developed resulted in Takotsubo Continue to correct electrolytes Patient should be relatively safe for discharge later today Have patient follow-up with cardiology 1 to 2 weeks   Alwyn Pea, MD 10/02/2020 1:30 PM

## 2020-10-02 NOTE — Progress Notes (Signed)
   10/01/20 2200  Assess: MEWS Score  Pulse Rate 70  ECG Heart Rate 70  Resp 13  SpO2 (!) 88 %  Technician validated all the vitals after 0545. Unable to rectify.

## 2020-10-02 NOTE — Progress Notes (Signed)
   10/02/20 0934  First Vascular Site Assessment  #1 - Location of Site Assessment Right femoral  #1 - Vascular Site Assessment Scale Level 0  RUE Neurovascular Assessment  R Radial Pulse +2  LUE Neurovascular Assessment  L Radial Pulse +2  RLE Neurovascular Assessment  R Dorsalis Pedis Pulse +2  LLE Neurovascular Assessment  L Dorsalis Pedis Pulse +2  Patient arrived from Cardiac Cath. Pt AxOx4. Call bell within reach. Bed alarm on. Report received from [facility].

## 2020-10-02 NOTE — Progress Notes (Signed)
PROGRESS NOTE    Caroline Miller  TDH:[id number] DOB: 1946/11/25 DOA: 09/29/2020 PCP: Patient, No Pcp Per   Brief Narrative:  Caroline Miller is a 74 y.o. female with medical history significant for hypertension, hyperlipidemia, was initially brought in as a Caroline Miller by a neighbor. She was later identified as Caroline Miller. She was brought to the hospital because of altered mental status. Reportedly, she was unable to speak in full sentences when EMS picked her up. Apparently, she was nonverbal in the ED and possibly had a seizure event while she was under CT scanner table in the radiology unit. She was given IV Ativan for this event.  She was admitted to the hospital for seizure and she was treated with IV Keppra and subsequently switched to oral Keppra. She was evaluated by the neurologist. EEG did not show any epileptiform activity.  As part of the work-up, 2D echo was obtained and there was an incidental finding of low EF of 30 to 35% and findings suggestive of stress-induced cardiomyopathy. Troponin was therefore obtained and this came back elevated at 7,421. Cardiologist was consulted for stress-induced cardiomyopathy and possible NSTEMI. She was started on IV heparin infusion, carvedilol, Entresto and aspirin.  Subjective: Patient has no new complaint today.  She was seen after the cardiac catheterization.  Wants to go home, discussed some elevation in her creatinine and she also received contrast with cardiac cath today, agrees to stay 1 more day for gentle IV fluid.  Assessment & Plan:   Principal Problem:   Seizure (HCC) Active Problems:   Hyponatremia   Hypokalemia   Stress-induced cardiomyopathy   NSTEMI (non-ST elevated myocardial infarction) (HCC)   Hypomagnesemia  Encephalopathy.  Resolved.  Patient appears to be at baseline. NSTEMI or seizure can be a possibility.  New onset HFrEF/NSTEMI/Takotsubo cardiomyopathy.  Echocardiogram with EF of 30 to 35%.  Concerning for Takotsubo  cardiomyopathy with apical ballooning.  Per patient she do have multiple stresses at home and job. Patient do have atypical chest pain/pressure couple of days ago. Troponin peaked at 7421 which makes it at NSTEMI range. She was started on heparin infusion and cardiology was consulted. Patient underwent cardiac catheterization this morning which shows normal coronaries and Takotsubo morphology with severely depressed EF. -Continue Entresto. -Start her on Crestor, elevated total cholesterol of 242 and LDL of 166.  AKI.  Creatinine increased to 1.22 with baseline below 1 this morning.  Patient also received a contrast with cardiac catheterization. -Give her gentle IV fluid. -Monitor for any worsening hypoxia as patient has low EF. -Monitor renal function.  Hyperlipidemia. Abnormal lipid profile with elevated total and LDL. -Starting her on Crestor. -Continue Zetia.  Seizure-like activity.  Patient has a witnessed seizure-like activity while in CT scan.  EEG was negative for any epileptic activity.  She was started on Keppra by neurology.  That might be due to transient cerebral ischemia secondary to some cardiac arrhythmia as patient had cardiomyopathy. Discussed with neurology and they are recommending continuation of Keppra as she did had prolonged postictal state. She was advised to follow-up with neurology as an outpatient for further management and to not drive for at least 6 months.  Chronic pain syndrome.  Patient is on opioids as an outpatient. Discussed tapering it down. -Continue with home medications and she will follow up with her primary care provider for further management.  Depression.  No acute concern. -Continue home meds.  Objective: Vitals:   10/02/20 0900 10/02/20 0927 10/02/20 1210 10/02/20 1611  BP: 95/78 99/67 101/66 (!) 93/53  Pulse: 65 72 71 85  Resp: 17 16 16 20   Temp:  97.6 F (36.4 C) 98 F (36.7 C) 97.7 F (36.5 C)  TempSrc:  Oral Oral Oral  SpO2: 94%  96% 97% 98%  Weight:      Height:        Intake/Output Summary (Last 24 hours) at 10/02/2020 1656 Last data filed at 10/02/2020 1034 Gross per 24 hour  Intake 792.1 ml  Output 1050 ml  Net -257.9 ml   Filed Weights   09/30/20 0000 10/02/20 0540 10/02/20 0718  Weight: 91.9 kg 91.5 kg 91.5 kg    Examination:  General.  Well-developed lady, in no acute distress. Pulmonary.  Lungs clear bilaterally, normal respiratory effort. CV.  Regular rate and rhythm, no JVD, rub or murmur. Abdomen.  Soft, nontender, nondistended, BS positive. CNS.  Alert and oriented x3.  No focal neurologic deficit. Extremities.  No edema, no cyanosis, pulses intact and symmetrical. Psychiatry.  Judgment and insight appears normal.  DVT prophylaxis: Heparin Code Status: Full Family Communication: Discussed with patient. Disposition Plan:  Status is: Inpatient  Remains inpatient appropriate because:Inpatient level of care appropriate due to severity of illness   Dispo: The patient is from: Home              Anticipated d/c is to: Home              Anticipated d/c date is: 1 day.              Patient currently is not medically stable to d/c.   Consultants:   Cardiology  Neurology  Procedures:  Antimicrobials:   Data Reviewed: I have personally reviewed following labs and imaging studies  CBC: Recent Labs  Lab 09/29/20 0635 10/01/20 0019 10/02/20 0421  WBC 8.0 8.6 9.3  NEUTROABS 5.0  --   --   HGB 12.2 11.8* 11.8*  HCT 34.2* 34.5* 33.5*  MCV 84.0 86.3 85.2  PLT 253 216 226   Basic Metabolic Panel: Recent Labs  Lab 09/29/20 0635 09/30/20 0502 10/01/20 0019 10/02/20 0421  NA 129* 133* 135 134*  K 3.4* 4.5 3.9 4.4  CL 96* 100 101 99  CO2 20* 24 26 26   GLUCOSE 113* 102* 115* 119*  BUN 9 7* 10 15  CREATININE 1.18* 0.95 1.01* 1.22*  CALCIUM 9.1 9.5 9.1 9.2  MG  --  1.4*  --   --    GFR: Estimated Creatinine Clearance: 43.2 mL/min (A) (by C-G formula based on SCr of 1.22 mg/dL  (H)). Liver Function Tests: Recent Labs  Lab 09/29/20 0635 09/30/20 0502  AST 28 47*  ALT 17 18  ALKPHOS 48 55  BILITOT 0.9 0.9  PROT 6.9 6.6  ALBUMIN 4.4 4.2   No results for input(s): LIPASE, AMYLASE in the last 168 hours. No results for input(s): AMMONIA in the last 168 hours. Coagulation Profile: Recent Labs  Lab 09/29/20 0635  INR 0.9   Cardiac Enzymes: No results for input(s): CKTOTAL, CKMB, CKMBINDEX, TROPONINI in the last 168 hours. BNP (last 3 results) No results for input(s): PROBNP in the last 8760 hours. HbA1C: Recent Labs    09/30/20 0502  HGBA1C 5.6   CBG: Recent Labs  Lab 09/29/20 0637  GLUCAP 130*   Lipid Profile: Recent Labs    09/30/20 1513  CHOL 242*  HDL 59  LDLCALC 166*  TRIG 84  CHOLHDL 4.1   Thyroid Function Tests: No results  for input(s): TSH, T4TOTAL, FREET4, T3FREE, THYROIDAB in the last 72 hours. Anemia Panel: No results for input(s): VITAMINB12, FOLATE, FERRITIN, TIBC, IRON, RETICCTPCT in the last 72 hours. Sepsis Labs: No results for input(s): PROCALCITON, LATICACIDVEN in the last 168 hours.  Recent Results (from the past 240 hour(s))  Respiratory Panel by RT PCR (Flu A&B, Covid) - Nasopharyngeal Swab     Status: None   Collection Time: 09/29/20  8:05 AM   Specimen: Nasopharyngeal Swab  Result Value Ref Range Status   SARS Coronavirus 2 by RT PCR NEGATIVE NEGATIVE Final    Comment: (NOTE) SARS-CoV-2 target nucleic acids are NOT DETECTED.  The SARS-CoV-2 RNA is generally detectable in upper respiratoy specimens during the acute phase of infection. The lowest concentration of SARS-CoV-2 viral copies this assay can detect is 131 copies/mL. A negative result does not preclude SARS-Cov-2 infection and should not be used as the sole basis for treatment or other patient management decisions. A negative result may occur with  improper specimen collection/handling, submission of specimen other than nasopharyngeal swab, presence  of viral mutation(s) within the areas targeted by this assay, and inadequate number of viral copies (<131 copies/mL). A negative result must be combined with clinical observations, patient history, and epidemiological information. The expected result is Negative.  Fact Sheet for Patients:  https://www.moore.com/  Fact Sheet for Healthcare Providers:  https://www.young.biz/  This test is no t yet approved or cleared by the [place] FDA and  has been authorized for detection and/or diagnosis of SARS-CoV-2 by FDA under an Emergency Use Authorization (EUA). This EUA will remain  in effect (meaning this test can be used) for the duration of the COVID-19 declaration under Section 564(b)(1) of the Act, 21 U.S.C. section 360bbb-3(b)(1), unless the authorization is terminated or revoked sooner.     Influenza A by PCR NEGATIVE NEGATIVE Final   Influenza B by PCR NEGATIVE NEGATIVE Final    Comment: (NOTE) The Xpert Xpress SARS-CoV-2/FLU/RSV assay is intended as an aid in  the diagnosis of influenza from Nasopharyngeal swab specimens and  should not be used as a sole basis for treatment. Nasal washings and  aspirates are unacceptable for Xpert Xpress SARS-CoV-2/FLU/RSV  testing.  Fact Sheet for Patients: https://www.moore.com/  Fact Sheet for Healthcare Providers: https://www.young.biz/  This test is not yet approved or cleared by the [place] FDA and  has been authorized for detection and/or diagnosis of SARS-CoV-2 by  FDA under an Emergency Use Authorization (EUA). This EUA will remain  in effect (meaning this test can be used) for the duration of the  Covid-19 declaration under Section 564(b)(1) of the Act, 21  U.S.C. section 360bbb-3(b)(1), unless the authorization is  terminated or revoked. Performed at [facility], [address], [place], [place] [address]      Radiology  Studies: CARDIAC CATHETERIZATION  Result Date: 10/02/2020  Normal coronaries  Takotsubo morphology on left ventriculogram  Severely depressed left ventricular function anterior apical ballooning pattern EF of 25%  Conclusion Cardiac cath because of elevated troponin Normal coronaries Severely depressed left ventricular function with a Takotsubo pattern EF less than 25% Recommend conservative medical therapy for cardiomyopathy and possible heart failure Minx deployed    Scheduled Meds: . aspirin  81 mg Oral Daily  . carvedilol  3.125 mg Oral BID WC  . DULoxetine  30 mg Oral Daily  . ezetimibe  10 mg Oral Daily  . levETIRAcetam  500 mg Oral BID  . pantoprazole  40 mg Oral  Daily  . rosuvastatin  40 mg Oral Daily  . sacubitril-valsartan  1 tablet Oral BID  . sodium chloride flush  3 mL Intravenous Q12H  . sodium chloride flush  3 mL Intravenous Q12H  . traZODone  150 mg Oral QHS   Continuous Infusions: . sodium chloride 100 mL/hr at 10/02/20 0932  . sodium chloride       LOS: 2 days   Time spent: 25 minutes.  Arnetha Courser, MD [facility]  If 7PM-7AM, please contact night-coverage [email or web address]  10/02/2020, 4:56 PM   This record has been created using Conservation officer, historic buildings. Errors have been sought and corrected,but may not always be located. Such creation errors do not reflect on the standard of care.

## 2020-10-02 NOTE — Progress Notes (Signed)
Pt is off the unit to Cardiac Cath.

## 2020-10-03 LAB — CBC
HCT: 33.3 % — ABNORMAL LOW (ref 36.0–46.0)
Hemoglobin: 11.8 g/dL — ABNORMAL LOW (ref 12.0–15.0)
MCH: 30.2 pg (ref 26.0–34.0)
MCHC: 35.4 g/dL (ref 30.0–36.0)
MCV: 85.2 fL (ref 80.0–100.0)
Platelets: 217 10*3/uL (ref 150–400)
RBC: 3.91 MIL/uL (ref 3.87–5.11)
RDW: 12.5 % (ref 11.5–15.5)
WBC: 8.1 10*3/uL (ref 4.0–10.5)
nRBC: 0 % (ref 0.0–0.2)

## 2020-10-03 LAB — BASIC METABOLIC PANEL
Anion gap: 9 (ref 5–15)
BUN: 21 mg/dL (ref 8–23)
CO2: 24 mmol/L (ref 22–32)
Calcium: 9.6 mg/dL (ref 8.9–10.3)
Chloride: 104 mmol/L (ref 98–111)
Creatinine, Ser: 1.14 mg/dL — ABNORMAL HIGH (ref 0.44–1.00)
GFR calc Af Amer: 55 mL/min — ABNORMAL LOW (ref >60)
GFR calc non Af Amer: 48 mL/min — ABNORMAL LOW (ref >60)
Glucose, Bld: 117 mg/dL — ABNORMAL HIGH (ref 70–99)
Potassium: 5.1 mmol/L (ref 3.5–5.1)
Sodium: 137 mmol/L (ref 135–145)

## 2020-10-03 MED ORDER — CARVEDILOL 3.125 MG PO TABS: 3.1250 mg | ORAL_TABLET | Freq: Two times a day (BID) | ORAL | 1 refills | Status: AC

## 2020-10-03 MED ORDER — ASPIRIN 81 MG PO CHEW: 81.0000 mg | CHEWABLE_TABLET | Freq: Every day | ORAL | 0 refills | Status: AC

## 2020-10-03 MED ORDER — LEVETIRACETAM 500 MG PO TABS
500.0000 mg | ORAL_TABLET | Freq: Two times a day (BID) | ORAL | 0 refills | Status: DC
Start: 2020-10-03 — End: 2021-11-14

## 2020-10-03 MED ORDER — ROSUVASTATIN CALCIUM 40 MG PO TABS: 40.0000 mg | ORAL_TABLET | Freq: Every day | ORAL | 0 refills | Status: AC

## 2020-10-03 MED ORDER — SACUBITRIL-VALSARTAN 49-51 MG PO TABS: 1.0000 | ORAL_TABLET | Freq: Two times a day (BID) | ORAL | 1 refills | Status: AC

## 2020-10-03 NOTE — Discharge Summary (Signed)
Physician Discharge Summary  Filicia Scogin FWY:[id number] DOB: 27-Aug-1946 DOA: 09/29/2020  PCP: Patient, No Pcp Per  Admit date: 09/29/2020 Discharge date: 10/03/2020  Admitted From: Home Disposition:  Home   Recommendations for Outpatient Follow-up:  1. Follow up with PCP in 1-2 weeks 2. Follow-up with cardiology 3. Follow-up with neurology 4. Please obtain BMP/CBC in one week 5. Please follow up on the following pending results: None  Home Health: No Equipment/Devices: None Discharge Condition: Stable CODE STATUS: Full Diet recommendation: Heart Healthy / Carb Modified   Brief/Interim Summary: Maddy Graham a 74 y.o.femalewith medical history significant for hypertension, hyperlipidemia, was initially brought in as a Verlin Dike a neighbor. She was later identifiedas Dois Davenport Mantel.She was brought to the hospital because of altered mental status. Reportedly, she was unable to speak in full sentences when EMS picked her up. Apparently, she was nonverbal in the EDand possibly had a seizure event while she was under CT scanner table in the radiology unit. She was given IV Ativan for this event.  She was admitted to the hospital for seizure and she was treated with IV Keppra and subsequently switched to oral Keppra. She was evaluated by the neurologist. EEG did not show any epileptiform activity.  As part of the work-up, 2D echo was obtained and there was an incidental finding of low EF of 30 to 35% and findings suggestive of stress-induced cardiomyopathy. Troponin was therefore obtained and this came back elevated at 7,421. Cardiologist was consulted for stress-induced cardiomyopathy and possible NSTEMI. She was started on IV heparin infusion, carvedilol, Entresto and aspirin, she was taken for cardiac catheterization which shows clean coronaries and morphology consistent with Takotsubo cardiomyopathy.  Her home dose of lisinopril and HCTZ was discontinued and she will continue with new  medications and follow-up with cardiology as an outpatient.  Patient has an abnormal lipid profile with elevated total cholesterol and LDL.  Goal LDL is less than 70.  She was started on Crestor along with Zetia and will have a close follow-up by her providers.  Patient has a witnessed seizure-like activity while in CT scan.  EEG was negative for any epileptic activity.  She was started on Keppra by neurology.  That might be due to transient cerebral ischemia secondary to some cardiac arrhythmia as patient had cardiomyopathy. Discussed with neurology and they are recommending continuation of Keppra as she did had prolonged postictal state. She was advised to follow-up with neurology as an outpatient for further management and to not drive for at least 6 months.  Patient takes chronic opioids for chronic pain syndrome at home.  We discussed tapering it down if possible.  Her primary care provider can work with her.  She will continue with rest of her home meds and follow-up with her primary care provider, cardiology and neurology.  Discharge Diagnoses:  Principal Problem:   Seizure (HCC) Active Problems:   Hyponatremia   Hypokalemia   Stress-induced cardiomyopathy   NSTEMI (non-ST elevated myocardial infarction) [facility])   Hypomagnesemia  Discharge Instructions  Discharge Instructions    (HEART FAILURE PATIENTS) Call MD:  Anytime you have any of the following symptoms: 1) 3 pound weight gain in 24 hours or 5 pounds in 1 week 2) shortness of breath, with or without a dry hacking cough 3) swelling in the hands, feet or stomach 4) if you have to sleep on extra pillows at night in order to breathe.   Complete by: As directed    AMB referral to CHF clinic  Complete by: As directed    Diet - low sodium heart healthy   Complete by: As directed    Discharge instructions   Complete by: As directed    It was pleasure taking care of you. We made multiple changes to your medications, please stop  taking your prior blood pressure medication and start taking these new as they will also help with your current heart failure. Please weigh yourself daily and if you noticed any swelling or increase in your weight of 3 pounds in 1 day or 5 pounds in a week please call your cardiologist as you are not been given any medication at this time to remove extra water, but you might needed in the future. You are also being started on seizure medications by neurology, because of your seizure is still unknown, please follow-up with neurology as an outpatient and they will help you discontinuing that medication down the road if there is no more seizure-like activity. Please do not drive until neurology clears you.   Increase activity slowly   Complete by: As directed      Allergies as of 10/03/2020      Reactions   Sulfa Antibiotics    Sulfur Swelling, Rash      Medication List    STOP taking these medications   hydrochlorothiazide 25 MG tablet Commonly known as: HYDRODIURIL   lisinopril 40 MG tablet Commonly known as: ZESTRIL     TAKE these medications   aspirin 81 MG chewable tablet Chew 1 tablet (81 mg total) by mouth daily.   busPIRone 5 MG tablet Commonly known as: BUSPAR Take 5 mg by mouth 3 (three) times daily as needed.   carvedilol 3.125 MG tablet Commonly known as: COREG Take 1 tablet (3.125 mg total) by mouth 2 (two) times daily with a meal.   cyclobenzaprine 5 MG tablet Commonly known as: FLEXERIL Take 5 mg by mouth 3 (three) times daily as needed.   DULoxetine 30 MG capsule Commonly known as: CYMBALTA Take 30 mg by mouth daily.   ezetimibe 10 MG tablet Commonly known as: ZETIA Take 10 mg by mouth daily.   HYDROcodone-acetaminophen 10-325 MG tablet Commonly known as: NORCO Take 1 tablet by mouth 3 (three) times daily as needed. for pain   hydrOXYzine 25 MG tablet Commonly known as: ATARAX/VISTARIL Take 25 mg by mouth every 8 (eight) hours as needed.    levETIRAcetam 500 MG tablet Commonly known as: KEPPRA Take 1 tablet (500 mg total) by mouth 2 (two) times daily.   meclizine 25 MG tablet Commonly known as: ANTIVERT Take 25 mg by mouth every 6 (six) hours as needed for dizziness.   meloxicam 7.5 MG tablet Commonly known as: MOBIC Take 7.5 mg by mouth 2 (two) times daily as needed.   omeprazole 40 MG capsule Commonly known as: PRILOSEC Take 40 mg by mouth daily.   ondansetron 4 MG disintegrating tablet Commonly known as: ZOFRAN-ODT Take 4 mg by mouth every 8 (eight) hours as needed for nausea/vomiting.   rosuvastatin 40 MG tablet Commonly known as: CRESTOR Take 1 tablet (40 mg total) by mouth daily.   sacubitril-valsartan 49-51 MG Commonly known as: ENTRESTO Take 1 tablet by mouth 2 (two) times daily.   traZODone 50 MG tablet Commonly known as: DESYREL Take 150 mg by mouth at bedtime.       Follow-up Information    Alwyn Pea, MD. Schedule an appointment as soon as possible for a visit on 10/06/2020.  Specialties: Cardiology, Internal Medicine Why: @ 8:30am Contact information: [address] [place] [address] [phone]        Lonell Face, MD. Schedule an appointment as soon as possible for a visit on 10/17/2020.   Specialty: Neurology Why: @ 8:30am Contact information: [address] [facility] [place] [address] [phone]        [facility] Follow up on 10/13/2020.   Specialty: Cardiology Why: at 8:30am. Enter through the Medical Mall entrance Contact information: [address] [place] [address] [phone]             Allergies  Allergen Reactions  . Sulfa Antibiotics   . Sulfur Swelling and Rash    Consultations:  Cardiology  Neurology  Procedures/Studies: EEG  Result Date: 09/29/2020 Charlsie Quest, MD     09/29/2020  5:28 PM Patient Name:  Kamaryn Grimley MRN: [id number] Epilepsy Attending: Charlsie Quest Referring Physician/Provider: Dr Ritta Slot Date: 09/29/2020 Duration: 21.51 mins Patient history: 74 y.o. female with no history of seizures per the patient who his been having some trouble with her speech since she woke up yesterday morning (per patient). EEG to evaluate for seizure. Level of alertness: Awake, asleep AEDs during EEG study: LEV Technical aspects: This EEG study was done with scalp electrodes positioned according to the 10-20 International system of electrode placement. Electrical activity was acquired at a sampling rate of 500Hz  and reviewed with a high frequency filter of 70Hz  and a low frequency filter of 1Hz . EEG data were recorded continuously and digitally stored. Description: No posterior dominant rhythm was seen. Sleep was characterized by sleep spindles (12 to 14 Hz), maximal frontocentral region. EEG showed continuous generalized 3 to 6 Hz theta-delta slowing. Physiologic photic driving was not seen during photic stimulation.  Hyperventilation was not performed.   ABNORMALITY -Continuous slow, generalized IMPRESSION: This study is suggestive of moderate diffuse encephalopathy, nonspecific etiology. No seizures or epileptiform discharges were seen throughout the recording. Charlsie Quest   CT Angio Head W or Wo Contrast  Result Date: 09/29/2020 CLINICAL DATA:  Neuro deficit EXAM: CT ANGIOGRAPHY HEAD AND NECK TECHNIQUE: Multidetector CT imaging of the head and neck was performed using the standard protocol during bolus administration of intravenous contrast. Multiplanar CT image reconstructions and MIPs were obtained to evaluate the vascular anatomy. Carotid stenosis measurements (when applicable) are obtained utilizing NASCET criteria, using the distal internal carotid diameter as the denominator. CONTRAST:  75mL OMNIPAQUE IOHEXOL 350 MG/ML SOLN COMPARISON:  None. FINDINGS: CT HEAD FINDINGS Brain: No evidence of  acute infarction, hemorrhage, hydrocephalus, extra-axial collection or mass lesion/mass effect. Vascular: No hyperdense vessel or unexpected calcification. 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 Hyperostosis interna. Sinuses: Clear Orbits: Gaze to the right. Review of the MIP images confirms the above findings CTA NECK FINDINGS Aortic arch: No acute finding. Four vessel branching with left vertebral artery arising from the arch Right carotid system: Mild atheromatous plaque at the ICA bulb. ICA tortuosity towards the skull base. No beading or ulceration. Left carotid system: Mild atherosclerosis Vertebral arteries: No proximal subclavian stenosis on the right. The left vertebral artery arises from the arch. Calcified plaque at the left vertebral origin with streak artifact limiting lumen visualization, estimated at mild or moderate. Skeleton: Cervical spine degeneration with mild C3-4 anterolisthesis. Other neck: No acute finding. Upper chest: Negative Review of the MIP images confirms the above findings CTA HEAD FINDINGS  Anterior circulation: Plaque along the carotid siphons. No branch occlusion, flow limiting stenosis, beading, or aneurysm. Posterior circulation: The vertebral and basilar arteries are smooth and widely patent. Hypoplastic right P1 segment with fetal type PCA flow. No branch occlusion, beading, or aneurysm. 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 Critical Value/emergent results were called by telephone at the time of interpretation on 09/29/2020 at 7:11 am to provider [facility] , who verbally acknowledged these results. Review of the MIP images confirms the above findings IMPRESSION: Head CT: No hemorrhage or visible infarct. CTA: 1. No emergent finding including large vessel occlusion. 2. Atherosclerosis without flow limiting stenosis of major vessels. Electronically Signed   By: Marnee Spring M.D.   On: 09/29/2020 07:16   DG  Abd 1 View  Result Date: 09/29/2020 CLINICAL DATA:  Evaluate for metallic foreign body prior to MRI EXAM: ABDOMEN - 1 VIEW COMPARISON:  None. FINDINGS: The bowel gas pattern is normal. No radio-opaque calculi or other significant radiographic abnormality are seen. No metallic radiopaque foreign body is identified within the abdomen. IMPRESSION: 1. No metallic radiopaque foreign body identified within the abdomen. 2. Nonobstructive bowel gas pattern. Electronically Signed   By: Duanne Guess D.O.   On: 09/29/2020 12:36   CT Angio Neck W and/or Wo Contrast  Result Date: 09/29/2020 CLINICAL DATA:  Neuro deficit EXAM: CT ANGIOGRAPHY HEAD AND NECK TECHNIQUE: Multidetector CT imaging of the head and neck was performed using the standard protocol during bolus administration of intravenous contrast. Multiplanar CT image reconstructions and MIPs were obtained to evaluate the vascular anatomy. Carotid stenosis measurements (when applicable) are obtained utilizing NASCET criteria, using the distal internal carotid diameter as the denominator. CONTRAST:  75mL OMNIPAQUE IOHEXOL 350 MG/ML SOLN COMPARISON:  None. FINDINGS: CT HEAD FINDINGS Brain: No evidence of acute infarction, hemorrhage, hydrocephalus, extra-axial collection or mass lesion/mass effect. Vascular: No hyperdense vessel or unexpected calcification. 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 Hyperostosis interna. Sinuses: Clear Orbits: Gaze to the right. Review of the MIP images confirms the above findings CTA NECK FINDINGS Aortic arch: No acute finding. Four vessel branching with left vertebral artery arising from the arch Right carotid system: Mild atheromatous plaque at the ICA bulb. ICA tortuosity towards the skull base. No beading or ulceration. Left carotid system: Mild atherosclerosis Vertebral arteries: No proximal subclavian stenosis on the right. The left vertebral artery arises from the arch. Calcified plaque at the left  vertebral origin with streak artifact limiting lumen visualization, estimated at mild or moderate. Skeleton: Cervical spine degeneration with mild C3-4 anterolisthesis. Other neck: No acute finding. Upper chest: Negative Review of the MIP images confirms the above findings CTA HEAD FINDINGS Anterior circulation: Plaque along the carotid siphons. No branch occlusion, flow limiting stenosis, beading, or aneurysm. Posterior circulation: The vertebral and basilar arteries are smooth and widely patent. Hypoplastic right P1 segment with fetal type PCA flow. No branch occlusion, beading, or aneurysm. 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 Critical Value/emergent results were called by telephone at the time of interpretation on 09/29/2020 at 7:11 am to provider [facility] , who verbally acknowledged these results. Review of the MIP images confirms the above findings IMPRESSION: Head CT: No hemorrhage or visible infarct. CTA: 1. No emergent finding including large vessel occlusion. 2. Atherosclerosis without flow limiting stenosis of major vessels. Electronically Signed   By: Marnee Spring M.D.   On: 09/29/2020 07:16   MR BRAIN WO CONTRAST  Result Date: 09/29/2020 CLINICAL DATA:  Acute neuro deficit.  Altered mental status EXAM: MRI HEAD WITHOUT CONTRAST TECHNIQUE: Multiplanar, multiecho pulse sequences of the brain and surrounding structures were obtained without intravenous contrast. COMPARISON:  CT angio head neck 09/29/2020 FINDINGS: Brain: No acute infarction, hemorrhage, hydrocephalus, extra-axial collection or mass lesion. Scattered small deep white matter hyperintensities bilaterally. Brainstem and cerebellum normal. Vascular: Normal arterial flow voids. Skull and upper cervical spine: Negative Sinuses/Orbits: Mild mucosal edema paranasal sinuses. Negative orbit Other: None IMPRESSION: No acute abnormality. Mild white matter changes most consistent with chronic  microvascular ischemia. Electronically Signed   By: Marlan Palauharles  Clark M.D.   On: 09/29/2020 14:01   CARDIAC CATHETERIZATION  Result Date: 10/02/2020  Normal coronaries  Takotsubo morphology on left ventriculogram  Severely depressed left ventricular function anterior apical ballooning pattern EF of 25%  Conclusion Cardiac cath because of elevated troponin Normal coronaries Severely depressed left ventricular function with a Takotsubo pattern EF less than 25% Recommend conservative medical therapy for cardiomyopathy and possible heart failure Minx deployed   DG Chest Portable 1 View  Result Date: 09/29/2020 CLINICAL DATA:  Confusion, weakness and altered mental status EXAM: PORTABLE CHEST 1 VIEW COMPARISON:  February 19, 2020 FINDINGS: Trachea midline. Cardiomediastinal contours are normal accounting for low lung volumes seen on today's exam. Subtle RIGHT lung base opacity above the RIGHT hemidiaphragm. No sign of effusion. On limited assessment skeletal structures without acute process. IMPRESSION: Low volume chest with subtle RIGHT basilar opacity likely atelectasis. Electronically Signed   By: Donzetta KohutGeoffrey  Wile M.D.   On: 09/29/2020 07:54   ECHOCARDIOGRAM COMPLETE  Result Date: 09/29/2020    ECHOCARDIOGRAM REPORT   [facility] Date of Exam: 09/29/2020 Medical Rec #:  [id number]   Height:       66.0 in Accession #:    [id number]  Weight:       219.6 lb Date of Birth:  07/24/1946   BSA:          2.081 m Patient Age:    74 years    BP:           125/112 mmHg Patient Gender: F           HR:           72 bpm. Exam Location:  [facility] Procedure: 2D Echo, Color Doppler and Cardiac Doppler Indications:     I163.9 Stroke  History:         Patient has no prior history of Echocardiogram examinations. No                  medical history.  Sonographer:     Humphrey RollsJoan Heiss RDCS (AE) Referring Phys:  [id number] Lucile ShuttersCHUKWU AGBATA Diagnosing Phys: Julien Nordmannimothy Gollan MD  Sonographer Comments: Suboptimal subcostal window.  IMPRESSIONS  1. Left ventricular ejection fraction, by estimation, is 30 %. The left ventricle has moderate to severly decreased function. The left ventricle demonstrates global hypokinesis/apical ballooning, with basal regions best preserved consistent with stress cardiomyopathy. Left ventricular diastolic parameters are consistent with Grade I diastolic dysfunction (impaired relaxation).  2. Right ventricular systolic function is normal. The right ventricular size is normal. There is mildly elevated pulmonary artery systolic pressure. The estimated right ventricular systolic pressure is 39.8 mmHg.  3. Mild mitral valve regurgitation. FINDINGS  Left Ventricle: Left ventricular ejection fraction, by estimation, is 30 to 35%. The left ventricle has moderately decreased function. The left ventricle demonstrates global hypokinesis. The left ventricular internal cavity  size was normal in size. There is no left ventricular hypertrophy. Left ventricular diastolic parameters are consistent with Grade I diastolic dysfunction (impaired relaxation). Right Ventricle: The right ventricular size is normal. No increase in right ventricular wall thickness. Right ventricular systolic function is normal. There is mildly elevated pulmonary artery systolic pressure. The tricuspid regurgitant velocity is 2.95  m/s, and with an assumed right atrial pressure of 5 mmHg, the estimated right ventricular systolic pressure is 39.8 mmHg. Left Atrium: Left atrial size was normal in size. Right Atrium: Right atrial size was normal in size. Pericardium: There is no evidence of pericardial effusion. Mitral Valve: The mitral valve is normal in structure. Mild mitral valve regurgitation. No evidence of mitral valve stenosis. MV peak gradient, 3.6 mmHg. The mean mitral valve gradient is 1.0 mmHg. Tricuspid Valve: The tricuspid valve is normal in structure. Tricuspid valve regurgitation is mild . No evidence of tricuspid stenosis. Aortic Valve: The  aortic valve is normal in structure. Aortic valve regurgitation is not visualized. No aortic stenosis is present. Aortic valve mean gradient measures 3.0 mmHg. Aortic valve peak gradient measures 4.8 mmHg. Aortic valve area, by VTI measures 1.79 cm. Pulmonic Valve: The pulmonic valve was normal in structure. Pulmonic valve regurgitation is not visualized. No evidence of pulmonic stenosis. Aorta: The aortic root is normal in size and structure. Venous: The inferior vena cava is normal in size with greater than 50% respiratory variability, suggesting right atrial pressure of 3 mmHg. IAS/Shunts: No atrial level shunt detected by color flow Doppler.  LEFT VENTRICLE PLAX 2D LVIDd:         5.01 cm  Diastology LVIDs:         2.73 cm  LV e' medial:    3.37 cm/s LV PW:         0.87 cm  LV E/e' medial:  10.9 LV IVS:        0.81 cm  LV e' lateral:   3.15 cm/s LVOT diam:     2.10 cm  LV E/e' lateral: 11.7 LV SV:         37 LV SV Index:   18 LVOT Area:     3.46 cm  RIGHT VENTRICLE RV Basal diam:  3.70 cm LEFT ATRIUM             Index       RIGHT ATRIUM          Index LA diam:        3.30 cm 1.59 cm/m  RA Area:     9.73 cm LA Vol (A2C):   36.3 ml 17.45 ml/m RA Volume:   18.40 ml 8.84 ml/m LA Vol (A4C):   21.7 ml 10.43 ml/m LA Biplane Vol: 30.7 ml 14.75 ml/m  AORTIC VALVE                   PULMONIC VALVE AV Area (Vmax):    1.77 cm    PV Vmax:       0.97 m/s AV Area (Vmean):   1.82 cm    PV Vmean:      65.500 cm/s AV Area (VTI):     1.79 cm    PV VTI:        0.197 m AV Vmax:           110.00 cm/s PV Peak grad:  3.8 mmHg AV Vmean:          78.300 cm/s PV Mean grad:  2.0 mmHg AV VTI:  0.207 m AV Peak Grad:      4.8 mmHg AV Mean Grad:      3.0 mmHg LVOT Vmax:         56.10 cm/s LVOT Vmean:        41.100 cm/s LVOT VTI:          0.107 m LVOT/AV VTI ratio: 0.52  AORTA Ao Root diam: 2.90 cm MITRAL VALVE               TRICUSPID VALVE MV Area (PHT): 7.66 cm    TR Peak grad:   34.8 mmHg MV Peak grad:  3.6 mmHg    TR  Vmax:        295.00 cm/s MV Mean grad:  1.0 mmHg MV Vmax:       0.95 m/s    SHUNTS MV Vmean:      48.8 cm/s   Systemic VTI:  0.11 m MV Decel Time: 99 msec     Systemic Diam: 2.10 cm MV E velocity: 36.80 cm/s MV A velocity: 96.80 cm/s MV E/A ratio:  0.38 Julien Nordmann MD Electronically signed by Julien Nordmann MD Signature Date/Time: 09/29/2020/2:52:45 PM    Final      Subjective: Patient has no new complaint when seen today.  She was eager to go home.  Discharge Exam: Vitals:   10/03/20 0800 10/03/20 1131  BP: 118/73 99/83  Pulse: 82 83  Resp: 19 18  Temp: 98.3 F (36.8 C) 98.9 F (37.2 C)  SpO2: 98% 99%   Vitals:   10/03/20 0308 10/03/20 0316 10/03/20 0800 10/03/20 1131  BP:  104/69 118/73 99/83  Pulse:  79 82 83  Resp:  16 19 18   Temp: 98.8 F (37.1 C)  98.3 F (36.8 C) 98.9 F (37.2 C)  TempSrc: Oral  Oral Oral  SpO2:  93% 98% 99%  Weight: 94.9 kg     Height:        General: Pt is alert, awake, not in acute distress Cardiovascular: RRR, S1/S2 +, no rubs, no gallops Respiratory: CTA bilaterally, no wheezing, no rhonchi Abdominal: Soft, NT, ND, bowel sounds + Extremities: no edema, no cyanosis   The results of significant diagnostics from this hospitalization (including imaging, microbiology, ancillary and laboratory) are listed below for reference.    Microbiology: Recent Results (from the past 240 hour(s))  Respiratory Panel by RT PCR (Flu A&B, Covid) - Nasopharyngeal Swab     Status: None   Collection Time: 09/29/20  8:05 AM   Specimen: Nasopharyngeal Swab  Result Value Ref Range Status   SARS Coronavirus 2 by RT PCR NEGATIVE NEGATIVE Final    Comment: (NOTE) SARS-CoV-2 target nucleic acids are NOT DETECTED.  The SARS-CoV-2 RNA is generally detectable in upper respiratoy specimens during the acute phase of infection. The lowest concentration of SARS-CoV-2 viral copies this assay can detect is 131 copies/mL. A negative result does not preclude  SARS-Cov-2 infection and should not be used as the sole basis for treatment or other patient management decisions. A negative result may occur with  improper specimen collection/handling, submission of specimen other than nasopharyngeal swab, presence of viral mutation(s) within the areas targeted by this assay, and inadequate number of viral copies (<131 copies/mL). A negative result must be combined with clinical observations, patient history, and epidemiological information. The expected result is Negative.  Fact Sheet for Patients:  11/29/20  Fact Sheet for Healthcare Providers:  https://www.moore.com/  This test is no t yet approved or cleared by  the [employer] and  has been authorized for detection and/or diagnosis of SARS-CoV-2 by FDA under an Emergency Use Authorization (EUA). This EUA will remain  in effect (meaning this test can be used) for the duration of the COVID-19 declaration under Section 564(b)(1) of the Act, 21 U.S.C. section 360bbb-3(b)(1), unless the authorization is terminated or revoked sooner.     Influenza A by PCR NEGATIVE NEGATIVE Final   Influenza B by PCR NEGATIVE NEGATIVE Final    Comment: (NOTE) The Xpert Xpress SARS-CoV-2/FLU/RSV assay is intended as an aid in  the diagnosis of influenza from Nasopharyngeal swab specimens and  should not be used as a sole basis for treatment. Nasal washings and  aspirates are unacceptable for Xpert Xpress SARS-CoV-2/FLU/RSV  testing.  Fact Sheet for Patients: https://www.moore.com/  Fact Sheet for Healthcare Providers: https://www.young.biz/  This test is not yet approved or cleared by the [place] FDA and  has been authorized for detection and/or diagnosis of SARS-CoV-2 by  FDA under an Emergency Use Authorization (EUA). This EUA will remain  in effect (meaning this test can be used) for the duration of the   Covid-19 declaration under Section 564(b)(1) of the Act, 21  U.S.C. section 360bbb-3(b)(1), unless the authorization is  terminated or revoked. Performed at [facility], [address] [place], [place] [address]      Labs: BNP (last 3 results) No results for input(s): BNP in the last 8760 hours. Basic Metabolic Panel: Recent Labs  Lab 09/29/20 0635 09/30/20 0502 10/01/20 0019 10/02/20 0421 10/03/20 0616  NA 129* 133* 135 134* 137  K 3.4* 4.5 3.9 4.4 5.1  CL 96* 100 101 99 104  CO2 20* GLUCOSE 113* 102* 115* 119* 117*  BUN 9 7* CREATININE 1.18* 0.95 1.01* 1.22* 1.14*  CALCIUM 9.1 9.5 9.1 9.2 9.6  MG  --  1.4*  --   --   --    Liver Function Tests: Recent Labs  Lab 09/29/20 0635 09/30/20 0502  AST 28 47*  ALT 17 18  ALKPHOS 48 55  BILITOT 0.9 0.9  PROT 6.9 6.6  ALBUMIN 4.4 4.2   No results for input(s): LIPASE, AMYLASE in the last 168 hours. No results for input(s): AMMONIA in the last 168 hours. CBC: Recent Labs  Lab 09/29/20 0635 10/01/20 0019 10/02/20 0421 10/03/20 0616  WBC 8.0 8.6 9.3 8.1  NEUTROABS 5.0  --   --   --   HGB 12.2 11.8* 11.8* 11.8*  HCT 34.2* 34.5* 33.5* 33.3*  MCV 84.0 86.3 85.2 85.2  PLT 253 216 226 217   Cardiac Enzymes: No results for input(s): CKTOTAL, CKMB, CKMBINDEX, TROPONINI in the last 168 hours. BNP: Invalid input(s): POCBNP CBG: Recent Labs  Lab 09/29/20 0637  GLUCAP 130*   D-Dimer No results for input(s): DDIMER in the last 72 hours. Hgb A1c No results for input(s): HGBA1C in the last 72 hours. Lipid Profile Recent Labs    09/30/20 1513  CHOL 242*  HDL 59  LDLCALC 166*  TRIG 84  CHOLHDL 4.1   Thyroid function studies No results for input(s): TSH, T4TOTAL, T3FREE, THYROIDAB in the last 72 hours.  Invalid input(s): FREET3 Anemia work up No results for input(s): VITAMINB12, FOLATE, FERRITIN, TIBC, IRON, RETICCTPCT in the last 72 hours. Urinalysis    Component Value  Date/Time   COLORURINE STRAW (A) 09/29/2020 0720   APPEARANCEUR CLEAR (A) 09/29/2020 0720   LABSPEC 1.012  09/29/2020 0720   PHURINE 8.0 09/29/2020 0720   GLUCOSEU NEGATIVE 09/29/2020 0720   HGBUR NEGATIVE 09/29/2020 0720   BILIRUBINUR NEGATIVE 09/29/2020 0720   KETONESUR NEGATIVE 09/29/2020 0720   PROTEINUR NEGATIVE 09/29/2020 0720   NITRITE NEGATIVE 09/29/2020 0720   LEUKOCYTESUR TRACE (A) 09/29/2020 0720   Sepsis Labs Invalid input(s): PROCALCITONIN,  WBC,  LACTICIDVEN Microbiology Recent Results (from the past 240 hour(s))  Respiratory Panel by RT PCR (Flu A&B, Covid) - Nasopharyngeal Swab     Status: None   Collection Time: 09/29/20  8:05 AM   Specimen: Nasopharyngeal Swab  Result Value Ref Range Status   SARS Coronavirus 2 by RT PCR NEGATIVE NEGATIVE Final    Comment: (NOTE) SARS-CoV-2 target nucleic acids are NOT DETECTED.  The SARS-CoV-2 RNA is generally detectable in upper respiratoy specimens during the acute phase of infection. The lowest concentration of SARS-CoV-2 viral copies this assay can detect is 131 copies/mL. A negative result does not preclude SARS-Cov-2 infection and should not be used as the sole basis for treatment or other patient management decisions. A negative result may occur with  improper specimen collection/handling, submission of specimen other than nasopharyngeal swab, presence of viral mutation(s) within the areas targeted by this assay, and inadequate number of viral copies (<131 copies/mL). A negative result must be combined with clinical observations, patient history, and epidemiological information. The expected result is Negative.  Fact Sheet for Patients:  https://www.moore.com/  Fact Sheet for Healthcare Providers:  https://www.young.biz/  This test is no t yet approved or cleared by the [place] FDA and  has been authorized for detection and/or diagnosis of SARS-CoV-2 by FDA under an  Emergency Use Authorization (EUA). This EUA will remain  in effect (meaning this test can be used) for the duration of the COVID-19 declaration under Section 564(b)(1) of the Act, 21 U.S.C. section 360bbb-3(b)(1), unless the authorization is terminated or revoked sooner.     Influenza A by PCR NEGATIVE NEGATIVE Final   Influenza B by PCR NEGATIVE NEGATIVE Final    Comment: (NOTE) The Xpert Xpress SARS-CoV-2/FLU/RSV assay is intended as an aid in  the diagnosis of influenza from Nasopharyngeal swab specimens and  should not be used as a sole basis for treatment. Nasal washings and  aspirates are unacceptable for Xpert Xpress SARS-CoV-2/FLU/RSV  testing.  Fact Sheet for Patients: https://www.moore.com/  Fact Sheet for Healthcare Providers: https://www.young.biz/  This test is not yet approved or cleared by the [place] FDA and  has been authorized for detection and/or diagnosis of SARS-CoV-2 by  FDA under an Emergency Use Authorization (EUA). This EUA will remain  in effect (meaning this test can be used) for the duration of the  Covid-19 declaration under Section 564(b)(1) of the Act, 21  U.S.C. section 360bbb-3(b)(1), unless the authorization is  terminated or revoked. Performed at [facility], [address] [place], [place] [address]     Time coordinating discharge: Over 30 minutes  SIGNED:  Arnetha Courser, MD  [facility] 10/03/2020, 12:09 PM  If 7PM-7AM, please contact night-coverage [email or web address]  This record has been created using Conservation officer, historic buildings. Errors have been sought and corrected,but may not always be located. Such creation errors do not reflect on the standard of care.

## 2020-10-03 NOTE — Plan of Care (Signed)
  Problem: Education: Goal: Knowledge of General Education information will improve Description: Including pain rating scale, medication(s)/side effects and non-pharmacologic comfort measures Outcome: Adequate for Discharge   

## 2020-10-03 NOTE — Care Management Important Message (Signed)
Important Message  Patient Details  Name: Caroline Miller MRN: [id number] Date of Birth: 07-24-1946   Medicare Important Message Given:  Yes     Johnell Comings 10/03/2020, 11:03 AM

## 2020-10-12 NOTE — Progress Notes (Deleted)
   Patient ID: Caroline Miller, female    DOB: September 17, 1946, 74 y.o.   MRN: [id number]  HPI  Ms Chumney is a 74 y/o female with a history of  Echo report from 09/29/20 reviewed and showed an EF of 30% along with mild MR and mildly elevated PA pressure of 39.8 mmHg.   LHC done 10/02/20 and showed:  Normal coronaries  Takotsubo morphology on left ventriculogram  Severely depressed left ventricular function anterior apical ballooning pattern EF of 25%  Admitted 09/29/20 due to AMS and possible seizure. Cardiology and neurology consults obtained. Given IV keppra with transition to oral medication. 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 Troponin came back elevated. LHC done and showed takotsubo syndrome. Discharged after 4 days.   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

## 2020-10-13 ENCOUNTER — Ambulatory Visit: Payer: Medicare HMO | Admitting: Family

## 2020-10-13 ENCOUNTER — Telehealth: Payer: Self-pay | Admitting: Family

## 2020-10-13 NOTE — Telephone Encounter (Signed)
Patient did not show for her [facility] appointment on 10/13/20. Will attempt to reschedule.

## 2021-11-14 ENCOUNTER — Encounter: Payer: Self-pay | Admitting: Ophthalmology

## 2021-11-19 NOTE — Discharge Instructions (Signed)

## 2021-11-21 ENCOUNTER — Ambulatory Visit
Admission: RE | Admit: 2021-11-21 | Discharge: 2021-11-21 | Disposition: A | Discharge status: Home / Self Care | Payer: Medicare HMO | Attending: Ophthalmology | Admitting: Ophthalmology

## 2021-11-21 ENCOUNTER — Other Ambulatory Visit: Payer: Self-pay

## 2021-11-21 ENCOUNTER — Encounter: Payer: Self-pay | Admitting: Ophthalmology

## 2021-11-21 ENCOUNTER — Encounter
Admission: RE | Disposition: A | Discharge status: Home / Self Care | Payer: Self-pay | Source: Home / Self Care | Attending: Ophthalmology

## 2021-11-21 ENCOUNTER — Ambulatory Visit: Payer: Medicare HMO | Admitting: Anesthesiology

## 2021-11-21 DIAGNOSIS — H2511 Age-related nuclear cataract, right eye: Secondary | ICD-10-CM | POA: Insufficient documentation

## 2021-11-21 DIAGNOSIS — I10 Essential (primary) hypertension: Secondary | ICD-10-CM | POA: Insufficient documentation

## 2021-11-21 DIAGNOSIS — Z87891 Personal history of nicotine dependence: Secondary | ICD-10-CM | POA: Insufficient documentation

## 2021-11-21 HISTORY — PX: CATARACT EXTRACTION W/PHACO: SHX586

## 2021-11-21 HISTORY — DX: Chronic obstructive pulmonary disease, unspecified: J44.9

## 2021-11-21 HISTORY — DX: Presence of dental prosthetic device (complete) (partial): Z97.2

## 2021-11-21 HISTORY — DX: Other chronic pain: G89.29

## 2021-11-21 HISTORY — DX: Unspecified osteoarthritis, unspecified site: M19.90

## 2021-11-21 HISTORY — DX: Anxiety disorder, unspecified: F41.9

## 2021-11-21 HISTORY — DX: Personal history of malignant neoplasm of other parts of uterus: Z85.42

## 2021-11-21 HISTORY — DX: Dizziness and giddiness: R42

## 2021-11-21 HISTORY — DX: Hyperlipidemia, unspecified: E78.5

## 2021-11-21 HISTORY — DX: Fibromyalgia: M79.7

## 2021-11-21 HISTORY — DX: Essential (primary) hypertension: I10

## 2021-11-21 SURGERY — PHACOEMULSIFICATION, CATARACT, WITH IOL INSERTION
Anesthesia: Monitor Anesthesia Care | Site: Eye | Laterality: Right

## 2021-11-21 MED ORDER — SIGHTPATH DOSE#1 NA HYALUR & NA CHOND-NA HYALUR IO KIT
PACK | INTRAOCULAR | Status: DC | PRN
  Administered 2021-11-21: 1 via OPHTHALMIC

## 2021-11-21 MED ORDER — ACETAMINOPHEN 325 MG PO TABS: 325.0000 mg | ORAL_TABLET | Freq: Once | ORAL | Status: DC

## 2021-11-21 MED ORDER — LACTATED RINGERS IV SOLN: INTRAVENOUS | Status: DC

## 2021-11-21 MED ORDER — MIDAZOLAM HCL 2 MG/2ML IJ SOLN
INTRAMUSCULAR | Status: DC | PRN
  Administered 2021-11-21 (×2): 1 mg via INTRAVENOUS

## 2021-11-21 MED ORDER — SIGHTPATH DOSE#1 BSS IO SOLN
INTRAOCULAR | Status: DC | PRN
  Administered 2021-11-21: 67 mL via OPHTHALMIC

## 2021-11-21 MED ORDER — SIGHTPATH DOSE#1 BSS IO SOLN
INTRAOCULAR | Status: DC | PRN
  Administered 2021-11-21: 1 mL

## 2021-11-21 MED ORDER — ACETAMINOPHEN 160 MG/5ML PO SOLN: 325.0000 mg | Freq: Once | ORAL | Status: DC

## 2021-11-21 MED ORDER — ARMC OPHTHALMIC DILATING DROPS
1.0000 "application " | OPHTHALMIC | Status: DC | PRN
  Administered 2021-11-21 (×3): 1 via OPHTHALMIC

## 2021-11-21 MED ORDER — BRIMONIDINE TARTRATE-TIMOLOL 0.2-0.5 % OP SOLN
OPHTHALMIC | Status: DC | PRN
  Administered 2021-11-21: 1 [drp] via OPHTHALMIC

## 2021-11-21 MED ORDER — FENTANYL CITRATE (PF) 100 MCG/2ML IJ SOLN
INTRAMUSCULAR | Status: DC | PRN
  Administered 2021-11-21: 50 ug via INTRAVENOUS

## 2021-11-21 MED ORDER — SIGHTPATH DOSE#1 BSS IO SOLN
INTRAOCULAR | Status: DC | PRN
  Administered 2021-11-21: 15 mL

## 2021-11-21 MED ORDER — CEFUROXIME OPHTHALMIC INJECTION 1 MG/0.1 ML
INJECTION | OPHTHALMIC | Status: DC | PRN
  Administered 2021-11-21: 0.1 mL via INTRACAMERAL

## 2021-11-21 MED ORDER — TETRACAINE HCL 0.5 % OP SOLN
1.0000 [drp] | OPHTHALMIC | Status: DC | PRN
  Administered 2021-11-21 (×3): 1 [drp] via OPHTHALMIC

## 2021-11-21 SURGICAL SUPPLY — 16 items
CANNULA ANT/CHMB 27GA (MISCELLANEOUS) IMPLANT
GLOVE SRG 8 PF TXTR STRL LF DI (GLOVE) ×1 IMPLANT
GLOVE SURG ENC TEXT LTX SZ7.5 (GLOVE) ×2 IMPLANT
GLOVE SURG UNDER POLY LF SZ8 (GLOVE) ×1
GOWN STRL REUS W/ TWL LRG LVL3 (GOWN DISPOSABLE) ×2 IMPLANT
GOWN STRL REUS W/TWL LRG LVL3 (GOWN DISPOSABLE) ×2
LENS IOL TECNIS EYHANCE 22.5 (Intraocular Lens) ×2 IMPLANT
MARKER SKIN DUAL TIP RULER LAB (MISCELLANEOUS) ×2 IMPLANT
NEEDLE CAPSULORHEX 25GA (NEEDLE) IMPLANT
NEEDLE FILTER BLUNT 18X 1/2SAF (NEEDLE) ×2
NEEDLE FILTER BLUNT 18X1 1/2 (NEEDLE) ×2 IMPLANT
PACK EYE AFTER SURG (MISCELLANEOUS) IMPLANT
SYR 3ML LL SCALE MARK (SYRINGE) ×4 IMPLANT
SYR TB 1ML LUER SLIP (SYRINGE) ×2 IMPLANT
WATER STERILE IRR 250ML POUR (IV SOLUTION) ×2 IMPLANT
WIPE NON LINTING 3.25X3.25 (MISCELLANEOUS) ×2 IMPLANT

## 2021-11-21 NOTE — Anesthesia Postprocedure Evaluation (Signed)
Anesthesia Post Note  Patient: Caroline Miller  Procedure(s) Performed: CATARACT EXTRACTION PHACO AND INTRAOCULAR LENS PLACEMENT (IOC) RIGHT 7.16 01:01.5 (Right: Eye)     Patient location during evaluation: PACU Anesthesia Type: MAC Level of consciousness: awake and alert and oriented Pain management: satisfactory to patient Vital Signs Assessment: post-procedure vital signs reviewed and stable Respiratory status: spontaneous breathing, nonlabored ventilation and respiratory function stable Cardiovascular status: blood pressure returned to baseline and stable Postop Assessment: Adequate PO intake and No signs of nausea or vomiting Anesthetic complications: no   No notable events documented.  Raliegh Ip

## 2021-11-21 NOTE — Transfer of Care (Signed)
Immediate Anesthesia Transfer of Care Note  Patient: Caroline Miller  Procedure(s) Performed: CATARACT EXTRACTION PHACO AND INTRAOCULAR LENS PLACEMENT (IOC) RIGHT 7.16 01:01.5 (Right: Eye)  Patient Location: PACU  Anesthesia Type: MAC  Level of Consciousness: awake, alert  and patient cooperative  Airway and Oxygen Therapy: Patient Spontanous Breathing and Patient connected to supplemental oxygen  Post-op Assessment: Post-op Vital signs reviewed, Patient's Cardiovascular Status Stable, Respiratory Function Stable, Patent Airway and No signs of Nausea or vomiting  Post-op Vital Signs: Reviewed and stable  Complications: No notable events documented.

## 2021-11-21 NOTE — Op Note (Signed)
  LOCATION:  [facility]   PREOPERATIVE DIAGNOSIS:    Nuclear sclerotic cataract right eye. H25.11   POSTOPERATIVE DIAGNOSIS:  Nuclear sclerotic cataract right eye.     PROCEDURE:  Phacoemusification with posterior chamber intraocular lens placement of the right eye   ULTRASOUND TIME: Procedure(s): CATARACT EXTRACTION PHACO AND INTRAOCULAR LENS PLACEMENT (IOC) RIGHT 7.16 01:01.5 (Right)  LENS:   Implant Name Type Inv. Item Serial No. Manufacturer Lot No. LRB No. Used Action  LENS IOL TECNIS EYHANCE 22.5 - [id number] Intraocular Lens LENS IOL TECNIS EYHANCE 22.5 [id number] [id number]   Right 1 Implanted         SURGEON:  Deirdre Evener, MD   ANESTHESIA:  Topical with tetracaine drops and 2% Xylocaine jelly, augmented with 1% preservative-free intracameral lidocaine.    COMPLICATIONS:  None.   DESCRIPTION OF PROCEDURE:  The patient was identified in the holding room and transported to the operating room and placed in the supine position under the operating microscope.  The right eye was identified as the operative eye and it was prepped and draped in the usual sterile ophthalmic fashion.   A 1 millimeter clear-corneal paracentesis was made at the 12:00 position.  0.5 ml of preservative-free 1% lidocaine was injected into the anterior chamber. The anterior chamber was filled with Viscoat viscoelastic.  A 2.4 millimeter keratome was used to make a near-clear corneal incision at the 9:00 position.  A curvilinear capsulorrhexis was made with a cystotome and capsulorrhexis forceps.  Balanced salt solution was used to hydrodissect and hydrodelineate the nucleus.   Phacoemulsification was then used in stop and chop fashion to remove the lens nucleus and epinucleus.  The remaining cortex was then removed using the irrigation and aspiration handpiece. Provisc was then placed into the capsular bag to distend it for lens placement.  A lens was then injected into the capsular bag.  The  remaining viscoelastic was aspirated.   Wounds were hydrated with balanced salt solution.  The anterior chamber was inflated to a physiologic pressure with balanced salt solution.  No wound leaks were noted. Cefuroxime 0.1 ml of a 10mg /ml solution was injected into the anterior chamber for a dose of 1 mg of intracameral antibiotic at the completion of the case.   Timolol and Brimonidine drops were applied to the eye.  The patient was taken to the recovery room in stable condition without complications of anesthesia or surgery.   Illyana Schorsch 11/21/2021, 11:54 AM

## 2021-11-21 NOTE — Anesthesia Preprocedure Evaluation (Signed)
Anesthesia Evaluation  Patient identified by MRN, date of birth, ID band Patient awake    Reviewed: Allergy & Precautions, H&P , NPO status , Patient's Chart, lab work & pertinent test results  Airway Mallampati: II  TM Distance: >3 FB Neck ROM: full    Dental no notable dental hx.    Pulmonary COPD, former smoker,    Pulmonary exam normal breath sounds clear to auscultation       Cardiovascular hypertension, + Past MI  Normal cardiovascular exam Rhythm:regular Rate:Normal     Neuro/Psych Seizures -,     GI/Hepatic   Endo/Other    Renal/GU      Musculoskeletal   Abdominal   Peds  Hematology   Anesthesia Other Findings   Reproductive/Obstetrics                             Anesthesia Physical Anesthesia Plan  ASA: 3  Anesthesia Plan: MAC   Post-op Pain Management:    Induction:   PONV Risk Score and Plan: 2 and Treatment may vary due to age or medical condition, TIVA and Midazolam  Airway Management Planned:   Additional Equipment:   Intra-op Plan:   Post-operative Plan:   Informed Consent: I have reviewed the patients History and Physical, chart, labs and discussed the procedure including the risks, benefits and alternatives for the proposed anesthesia with the patient or authorized representative who has indicated his/her understanding and acceptance.     Dental Advisory Given  Plan Discussed with: CRNA  Anesthesia Plan Comments:         Anesthesia Quick Evaluation

## 2021-11-21 NOTE — H&P (Signed)
[facility]   Primary Care Physician:  [place], Julie A, MD Ophthalmologist: Dr. Lockie Mola  Pre-Procedure History & Physical: HPI:  Caroline Miller is a 75 y.o. female here for ophthalmic surgery.   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   Chronic pain    COPD (chronic obstructive pulmonary disease) (HCC)    Fibromyalgia    History of endometrial cancer    Hyperlipidemia    Hypertension    Seizure-like activity (HCC) 09/2020   Vertigo    improved with iron supplements   Wears dentures    full upper and lower    Past Surgical History:  Procedure Laterality Date   LEFT HEART CATH AND CORONARY ANGIOGRAPHY N/A 10/02/2020   Procedure: LEFT HEART CATH AND CORONARY ANGIOGRAPHY with possible PCI and stent;  Surgeon: Alwyn Pea, MD;  Location: [facility]VAS[facility] CV [facility];  Service: Cardiovascular;  Laterality: N/A;    Prior to Admission medications   Medication Sig Start Date End Date Taking? Authorizing Provider  ARIPiprazole (ABILIFY) 10 MG tablet Take 10 mg by mouth daily.   Yes [provider]  aspirin 81 MG chewable tablet Chew 1 tablet (81 mg total) by mouth daily. 10/03/20  Yes Arnetha Courser, MD  Cholecalciferol (VITAMIN D3) 25 MCG (1000 UT) CHEW Chew by mouth daily.   Yes [provider]  DULoxetine (CYMBALTA) 30 MG capsule Take 60 mg by mouth daily. 09/05/20  Yes [provider]  Ferrous Sulfate (IRON PO) Take by mouth daily.   Yes [provider]  HYDROcodone-acetaminophen (NORCO) 10-325 MG tablet Take 1 tablet by mouth 3 (three) times daily as needed. for pain 09/23/20  Yes [provider]  loratadine (CLARITIN) 10 MG tablet Take 10 mg by mouth daily as needed for allergies.   Yes [provider]  meclizine (ANTIVERT) 25 MG tablet Take 25 mg by mouth every 6 (six) hours as needed for dizziness. 09/12/20  Yes [provider]  meloxicam (MOBIC)  7.5 MG tablet Take 7.5 mg by mouth 2 (two) times daily as needed. 07/31/20  Yes [provider]  omeprazole (PRILOSEC) 40 MG capsule Take 40 mg by mouth daily. 08/26/20  Yes [provider]  sacubitril-valsartan (ENTRESTO) 49-51 MG Take 1 tablet by mouth 2 (two) times daily. 10/03/20  Yes Arnetha Courser, MD  carvedilol (COREG) 3.125 MG tablet Take 1 tablet (3.125 mg total) by mouth 2 (two) times daily with a meal. Patient not taking: Reported on 11/21/2021 10/03/20   Arnetha Courser, MD  ondansetron (ZOFRAN-ODT) 4 MG disintegrating tablet Take 4 mg by mouth every 8 (eight) hours as needed for nausea/vomiting. Patient not taking: Reported on 11/14/2021 09/12/20   [provider]  rosuvastatin (CRESTOR) 40 MG tablet Take 1 tablet (40 mg total) by mouth daily. Patient not taking: Reported on 11/21/2021 10/03/20   Arnetha Courser, MD  traZODone (DESYREL) 50 MG tablet Take 150 mg by mouth at bedtime. Patient not taking: Reported on 11/21/2021 06/19/20   [provider]    Allergies as of 10/02/2021 - Review Complete 10/02/2020  Allergen Reaction Noted   Elemental sulfur Swelling and Rash 09/29/2020   Sulfa antibiotics  09/29/2020    History reviewed. No pertinent family history.  Social History   Socioeconomic History   Marital status: Widowed    Spouse name: Not on file   Number of children: Not on file   Years of education:  Not on file   Highest education level: Not on file  Occupational History   Not on file  Tobacco Use   Smoking status: Former    Types: Cigarettes    Quit date: 2008    Years since quitting: 14.9   Smokeless tobacco: Never  Vaping Use   Vaping Use: Every day   Substances: Nicotine  Substance and Sexual Activity   Alcohol use: Yes    Comment: occasionally   Drug use: Not on file   Sexual activity: Not on file  Other Topics Concern   Not on file  Social History Narrative   Not on file   Social Determinants of Health   Financial  Resource Strain: Not on file  Food Insecurity: Not on file  Transportation Needs: Not on file  Physical Activity: Not on file  Stress: Not on file  Social Connections: Not on file  Intimate Partner Violence: Not on file    Review of Systems: See HPI, otherwise negative ROS  Physical Exam: BP 131/68   Pulse 62   Temp (!) 97.3 F (36.3 C) (Temporal)   Resp 18   Ht 5\' 2"  (1.575 m)   Wt 79.4 kg   SpO2 100%   BMI 32.01 kg/m  General:   Alert,  pleasant and cooperative in NAD Head:  Normocephalic and atraumatic. Lungs:  Clear to auscultation.    Heart:  Regular rate and rhythm.   Impression/Plan: Caroline Miller is here for ophthalmic surgery.  Risks, benefits, limitations, and alternatives regarding ophthalmic surgery have been reviewed with the patient.  Questions have been answered.  All parties agreeable.   Pearson Grippe, MD  11/21/2021, 10:35 AM

## 2021-11-21 NOTE — Anesthesia Procedure Notes (Signed)
Procedure Name: MAC Date/Time: 11/21/2021 11:29 AM Performed by: Cameron Ali, CRNA Pre-anesthesia Checklist: Patient identified, Emergency Drugs available, Suction available, Timeout performed and Patient being monitored Patient Re-evaluated:Patient Re-evaluated prior to induction Oxygen Delivery Method: Nasal cannula Placement Confirmation: positive ETCO2

## 2021-12-03 NOTE — Discharge Instructions (Signed)

## 2021-12-04 ENCOUNTER — Encounter: Payer: Self-pay | Admitting: Ophthalmology

## 2021-12-05 ENCOUNTER — Encounter
Admission: RE | Disposition: A | Discharge status: Home / Self Care | Payer: Self-pay | Source: Home / Self Care | Attending: Ophthalmology

## 2021-12-05 ENCOUNTER — Encounter: Payer: Self-pay | Admitting: Ophthalmology

## 2021-12-05 ENCOUNTER — Ambulatory Visit: Payer: Medicare HMO | Admitting: Anesthesiology

## 2021-12-05 ENCOUNTER — Ambulatory Visit
Admission: RE | Admit: 2021-12-05 | Discharge: 2021-12-05 | Disposition: A | Discharge status: Home / Self Care | Payer: Medicare HMO | Attending: Ophthalmology | Admitting: Ophthalmology

## 2021-12-05 ENCOUNTER — Other Ambulatory Visit: Payer: Self-pay

## 2021-12-05 DIAGNOSIS — H2512 Age-related nuclear cataract, left eye: Secondary | ICD-10-CM | POA: Diagnosis not present

## 2021-12-05 DIAGNOSIS — Z87891 Personal history of nicotine dependence: Secondary | ICD-10-CM | POA: Diagnosis not present

## 2021-12-05 HISTORY — PX: CATARACT EXTRACTION W/PHACO: SHX586

## 2021-12-05 SURGERY — PHACOEMULSIFICATION, CATARACT, WITH IOL INSERTION
Anesthesia: Monitor Anesthesia Care | Site: Eye | Laterality: Left

## 2021-12-05 MED ORDER — CEFUROXIME OPHTHALMIC INJECTION 1 MG/0.1 ML
INJECTION | OPHTHALMIC | Status: DC | PRN
  Administered 2021-12-05: 0.1 mL via INTRACAMERAL

## 2021-12-05 MED ORDER — SIGHTPATH DOSE#1 BSS IO SOLN
INTRAOCULAR | Status: DC | PRN
  Administered 2021-12-05: 80 mL via OPHTHALMIC

## 2021-12-05 MED ORDER — MIDAZOLAM HCL 2 MG/2ML IJ SOLN
INTRAMUSCULAR | Status: DC | PRN
  Administered 2021-12-05: 2 mg via INTRAVENOUS

## 2021-12-05 MED ORDER — SIGHTPATH DOSE#1 BSS IO SOLN
INTRAOCULAR | Status: DC | PRN
  Administered 2021-12-05: 15 mL

## 2021-12-05 MED ORDER — ARMC OPHTHALMIC DILATING DROPS
1.0000 "application " | OPHTHALMIC | Status: DC | PRN
  Administered 2021-12-05 (×3): 1 via OPHTHALMIC

## 2021-12-05 MED ORDER — ONDANSETRON HCL 4 MG/2ML IJ SOLN: 4.0000 mg | Freq: Once | INTRAMUSCULAR | Status: DC | PRN

## 2021-12-05 MED ORDER — SIGHTPATH DOSE#1 BSS IO SOLN
INTRAOCULAR | Status: DC | PRN
  Administered 2021-12-05: 1 mL via INTRAMUSCULAR

## 2021-12-05 MED ORDER — TETRACAINE HCL 0.5 % OP SOLN
1.0000 [drp] | OPHTHALMIC | Status: DC | PRN
  Administered 2021-12-05 (×3): 1 [drp] via OPHTHALMIC

## 2021-12-05 MED ORDER — FENTANYL CITRATE (PF) 100 MCG/2ML IJ SOLN
INTRAMUSCULAR | Status: DC | PRN
  Administered 2021-12-05 (×2): 50 ug via INTRAVENOUS

## 2021-12-05 MED ORDER — LACTATED RINGERS IV SOLN: INTRAVENOUS | Status: DC

## 2021-12-05 MED ORDER — BRIMONIDINE TARTRATE-TIMOLOL 0.2-0.5 % OP SOLN
OPHTHALMIC | Status: DC | PRN
  Administered 2021-12-05: 1 [drp] via OPHTHALMIC

## 2021-12-05 MED ORDER — SIGHTPATH DOSE#1 NA HYALUR & NA CHOND-NA HYALUR IO KIT
PACK | INTRAOCULAR | Status: DC | PRN
  Administered 2021-12-05: 1 via OPHTHALMIC

## 2021-12-05 MED ORDER — ACETAMINOPHEN 10 MG/ML IV SOLN: 1000.0000 mg | Freq: Once | INTRAVENOUS | Status: DC | PRN

## 2021-12-05 SURGICAL SUPPLY — 7 items
GLOVE SRG 8 PF TXTR STRL LF DI (GLOVE) ×1 IMPLANT
GLOVE SURG ENC TEXT LTX SZ7.5 (GLOVE) ×2 IMPLANT
GLOVE SURG UNDER POLY LF SZ8 (GLOVE) ×2
LENS IOL TECNIS EYHANCE 22.0 (Intraocular Lens) ×2 IMPLANT
PACK EYE AFTER SURG (MISCELLANEOUS) ×2 IMPLANT
SYR 3ML LL SCALE MARK (SYRINGE) ×4 IMPLANT
WATER STERILE IRR 250ML POUR (IV SOLUTION) ×2 IMPLANT

## 2021-12-05 NOTE — H&P (Signed)
[facility]   Primary Care Physician:  [place], Julie A, MD Ophthalmologist: Dr. Lockie Mola  Pre-Procedure History & Physical: HPI:  Caroline Miller is a 75 y.o. female here for ophthalmic surgery.   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   Chronic pain    COPD (chronic obstructive pulmonary disease) (HCC)    Fibromyalgia    History of endometrial cancer    Hyperlipidemia    Hypertension    Seizure-like activity (HCC) 09/2020   Vertigo    improved with iron supplements   Wears dentures    full upper and lower    Past Surgical History:  Procedure Laterality Date   CATARACT EXTRACTION W/PHACO Right 11/21/2021   Procedure: CATARACT EXTRACTION PHACO AND INTRAOCULAR LENS PLACEMENT (IOC) RIGHT 7.16 01:01.5;  Surgeon: Lockie Mola, MD;  Location: [facility];  Service: Ophthalmology;  Laterality: Right;   LEFT HEART CATH AND CORONARY ANGIOGRAPHY N/A 10/02/2020   Procedure: LEFT HEART CATH AND CORONARY ANGIOGRAPHY with possible PCI and stent;  Surgeon: Alwyn Pea, MD;  Location: [facility] INVASIVE CV LAB;  Service: Cardiovascular;  Laterality: N/A;    Prior to Admission medications   Medication Sig Start Date End Date Taking? Authorizing Provider  ARIPiprazole (ABILIFY) 10 MG tablet Take 10 mg by mouth daily.   Yes [provider]  aspirin 81 MG chewable tablet Chew 1 tablet (81 mg total) by mouth daily. 10/03/20  Yes Arnetha Courser, MD  Cholecalciferol (VITAMIN D3) 25 MCG (1000 UT) CHEW Chew by mouth daily.   Yes [provider]  DULoxetine (CYMBALTA) 30 MG capsule Take 60 mg by mouth daily. 09/05/20  Yes [provider]  HYDROcodone-acetaminophen (NORCO) 10-325 MG tablet Take 1 tablet by mouth 3 (three) times daily as needed. for pain 09/23/20  Yes [provider]  loratadine (CLARITIN) 10 MG tablet Take 10 mg by mouth daily as needed for allergies.   Yes  [provider]  meloxicam (MOBIC) 7.5 MG tablet Take 7.5 mg by mouth 2 (two) times daily as needed. 07/31/20  Yes [provider]  omeprazole (PRILOSEC) 40 MG capsule Take 40 mg by mouth daily. 08/26/20  Yes [provider]  sacubitril-valsartan (ENTRESTO) 49-51 MG Take 1 tablet by mouth 2 (two) times daily. 10/03/20  Yes Arnetha Courser, MD  carvedilol (COREG) 3.125 MG tablet Take 1 tablet (3.125 mg total) by mouth 2 (two) times daily with a meal. Patient not taking: Reported on 11/21/2021 10/03/20   Arnetha Courser, MD  Ferrous Sulfate (IRON PO) Take by mouth daily.    [provider]  meclizine (ANTIVERT) 25 MG tablet Take 25 mg by mouth every 6 (six) hours as needed for dizziness. 09/12/20   [provider]  ondansetron (ZOFRAN-ODT) 4 MG disintegrating tablet Take 4 mg by mouth every 8 (eight) hours as needed for nausea/vomiting. Patient not taking: Reported on 11/14/2021 09/12/20   [provider]  rosuvastatin (CRESTOR) 40 MG tablet Take 1 tablet (40 mg total) by mouth daily. Patient not taking: Reported on 11/21/2021 10/03/20   Arnetha Courser, MD  traZODone (DESYREL) 50 MG tablet Take 150 mg by mouth at bedtime. Patient not taking: Reported on 11/21/2021 06/19/20   [provider]    Allergies as of 10/02/2021 - Review Complete 10/02/2020  Allergen Reaction Noted   Elemental sulfur Swelling and Rash 09/29/2020   Sulfa antibiotics  09/29/2020    History reviewed.  No pertinent family history.  Social History   Socioeconomic History   Marital status: Widowed    Spouse name: Not on file   Number of children: Not on file   Years of education: Not on file   Highest education level: Not on file  Occupational History   Not on file  Tobacco Use   Smoking status: Former    Types: Cigarettes    Quit date: 2008    Years since quitting: 14.9   Smokeless tobacco: Never  Vaping Use   Vaping Use: Every day   Substances: Nicotine   Substance and Sexual Activity   Alcohol use: Yes    Comment: occasionally   Drug use: Not on file   Sexual activity: Not on file  Other Topics Concern   Not on file  Social History Narrative   Not on file   Social Determinants of Health   Financial Resource Strain: Not on file  Food Insecurity: Not on file  Transportation Needs: Not on file  Physical Activity: Not on file  Stress: Not on file  Social Connections: Not on file  Intimate Partner Violence: Not on file    Review of Systems: See HPI, otherwise negative ROS  Physical Exam: BP 117/71   Pulse 68   Temp (!) 97.2 F (36.2 C) (Temporal)   Resp 18   Ht 5\' 2"  (1.575 m)   Wt 78.5 kg   SpO2 98%   BMI 31.64 kg/m  General:   Alert,  pleasant and cooperative in NAD Head:  Normocephalic and atraumatic. Lungs:  Clear to auscultation.    Heart:  Regular rate and rhythm.   Impression/Plan: Caroline Miller is here for ophthalmic surgery.  Risks, benefits, limitations, and alternatives regarding ophthalmic surgery have been reviewed with the patient.  Questions have been answered.  All parties agreeable.   Pearson Grippe, MD  12/05/2021, 9:57 AM

## 2021-12-05 NOTE — Anesthesia Preprocedure Evaluation (Signed)
Anesthesia Evaluation  Patient identified by MRN, date of birth, ID band Patient awake    Reviewed: Allergy & Precautions, H&P , NPO status , Patient's Chart, lab work & pertinent test results  Airway Mallampati: III  TM Distance: >3 FB Neck ROM: Limited    Dental no notable dental hx. 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)  Normal cardiovascular exam Rhythm:regular Rate:Normal   HLD   Neuro/Psych Seizures -,     GI/Hepatic   Endo/Other    Renal/GU      Musculoskeletal  (+) Arthritis , Fibromyalgia -  Abdominal (+) + obese (BMI 32),   Peds  Hematology   Anesthesia Other Findings   Reproductive/Obstetrics                             Anesthesia Physical  Anesthesia Plan  ASA: 3  Anesthesia Plan: MAC   Post-op Pain Management:    Induction: Intravenous  PONV Risk Score and Plan: 2 and Treatment may vary due to age or medical condition, TIVA and Midazolam  Airway Management Planned: Nasal Cannula  Additional Equipment:   Intra-op Plan:   Post-operative Plan:   Informed Consent: I have reviewed the patients History and Physical, chart, labs and discussed the procedure including the risks, benefits and alternatives for the proposed anesthesia with the patient or authorized representative who has indicated his/her understanding and acceptance.     Dental Advisory Given  Plan Discussed with: CRNA  Anesthesia Plan Comments:         Anesthesia Quick Evaluation

## 2021-12-05 NOTE — Anesthesia Postprocedure Evaluation (Signed)
Anesthesia Post Note  Patient: Caroline Miller  Procedure(s) Performed: CATARACT EXTRACTION PHACO AND INTRAOCULAR LENS PLACEMENT (IOC) LEFT 7.22 01:11.8 (Left: Eye)     Patient location during evaluation: PACU Anesthesia Type: MAC Level of consciousness: awake and alert Pain management: pain level controlled Vital Signs Assessment: post-procedure vital signs reviewed and stable Respiratory status: spontaneous breathing, nonlabored ventilation, respiratory function stable and patient connected to nasal cannula oxygen Cardiovascular status: stable and blood pressure returned to baseline Postop Assessment: no apparent nausea or vomiting Anesthetic complications: no   No notable events documented.  Chalsey Leeth A  Reeshemah Nazaryan

## 2021-12-05 NOTE — Op Note (Signed)
OPERATIVE NOTE  Caroline Miller [id number] 12/05/2021   PREOPERATIVE DIAGNOSIS:  Nuclear sclerotic cataract left eye. H25.12   POSTOPERATIVE DIAGNOSIS:    Nuclear sclerotic cataract left eye.     PROCEDURE:  Phacoemusification with posterior chamber intraocular lens placement of the left eye  Ultrasound time: Procedure(s): CATARACT EXTRACTION PHACO AND INTRAOCULAR LENS PLACEMENT (IOC) LEFT 7.22 01:11.8 (Left)  LENS:   Implant Name Type Inv. Item Serial No. Manufacturer Lot No. LRB No. Used Action  LENS IOL TECNIS EYHANCE 22.0 - [id number] Intraocular Lens LENS IOL TECNIS EYHANCE 22.0 [id number] JOHNSON   Left 1 Implanted      SURGEON:  Deirdre Evener, MD   ANESTHESIA:  Topical with tetracaine drops and 2% Xylocaine jelly, augmented with 1% preservative-free intracameral lidocaine.    COMPLICATIONS:  None.   DESCRIPTION OF PROCEDURE:  The patient was identified in the holding room and transported to the operating room and placed in the supine position under the operating microscope.  The left eye was identified as the operative eye and it was prepped and draped in the usual sterile ophthalmic fashion.   A 1 millimeter clear-corneal paracentesis was made at the 1:30 position.  0.5 ml of preservative-free 1% lidocaine was injected into the anterior chamber.  The anterior chamber was filled with Viscoat viscoelastic.  A 2.4 millimeter keratome was used to make a near-clear corneal incision at the 10:30 position.  .  A curvilinear capsulorrhexis was made with a cystotome and capsulorrhexis forceps.  Balanced salt solution was used to hydrodissect and hydrodelineate the nucleus.   Phacoemulsification was then used in stop and chop fashion to remove the lens nucleus and epinucleus.  The remaining cortex was then removed using the irrigation and aspiration handpiece. Provisc was then placed into the capsular bag to distend it for lens placement.  A lens was then injected into the capsular  bag.  The remaining viscoelastic was aspirated.   Wounds were hydrated with balanced salt solution.  The anterior chamber was inflated to a physiologic pressure with balanced salt solution.  No wound leaks were noted. Cefuroxime 0.1 ml of a 10mg /ml solution was injected into the anterior chamber for a dose of 1 mg of intracameral antibiotic at the completion of the case.   Timolol and Brimonidine drops were applied to the eye.  The patient was taken to the recovery room in stable condition without complications of anesthesia or surgery.  [name] 12/05/2021, 11:14 AM

## 2021-12-05 NOTE — Transfer of Care (Signed)
Immediate Anesthesia Transfer of Care Note  Patient: Caroline Miller  Procedure(s) Performed: CATARACT EXTRACTION PHACO AND INTRAOCULAR LENS PLACEMENT (IOC) LEFT 7.22 01:11.8 (Left: Eye)  Patient Location: PACU  Anesthesia Type: MAC  Level of Consciousness: awake, alert  and patient cooperative  Airway and Oxygen Therapy: Patient Spontanous Breathing and Patient connected to supplemental oxygen  Post-op Assessment: Post-op Vital signs reviewed, Patient's Cardiovascular Status Stable, Respiratory Function Stable, Patent Airway and No signs of Nausea or vomiting  Post-op Vital Signs: Reviewed and stable  Complications: No notable events documented.

## 2021-12-06 ENCOUNTER — Encounter: Payer: Self-pay | Admitting: Ophthalmology

## 2021-12-06 LAB — COLOGUARD: COLOGUARD: NEGATIVE

## 2021-12-15 IMAGING — CT CT ANGIO HEAD
1 of 10 series · 6 of 35 positions shown · IV contrast (APPLIED)
Comparison: None.

CLINICAL DATA: Neuro deficit

EXAM:
CT ANGIOGRAPHY HEAD AND NECK
TECHNIQUE: Multidetector CT imaging of the head and neck was performed using
the standard protocol during bolus administration of intravenous
contrast. Multiplanar CT image reconstructions and MIPs were
obtained to evaluate the vascular anatomy. Carotid stenosis
measurements (when applicable) are obtained utilizing NASCET
criteria, using the distal internal carotid diameter as the
denominator.
CONTRAST:  75mL OMNIPAQUE IOHEXOL 350 MG/ML SOLN

[Series 11: ax thin · axial · 0.56mm/px · z∈[-320,-52]mm · 6 of 397 slices shown]
[im 57/397  soft-tissue]
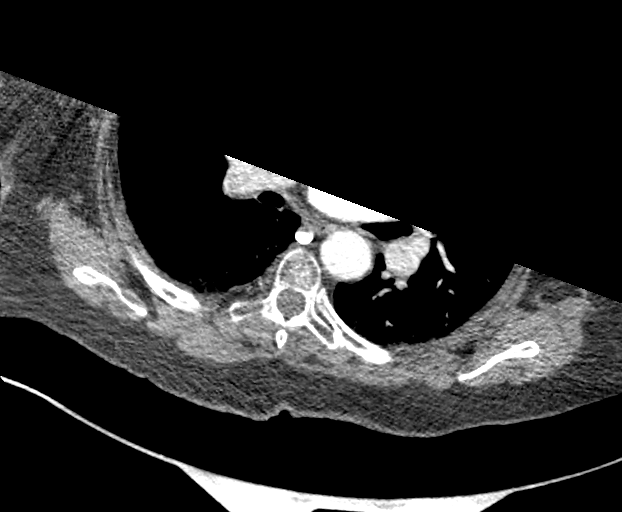
[im 114/397  bone]
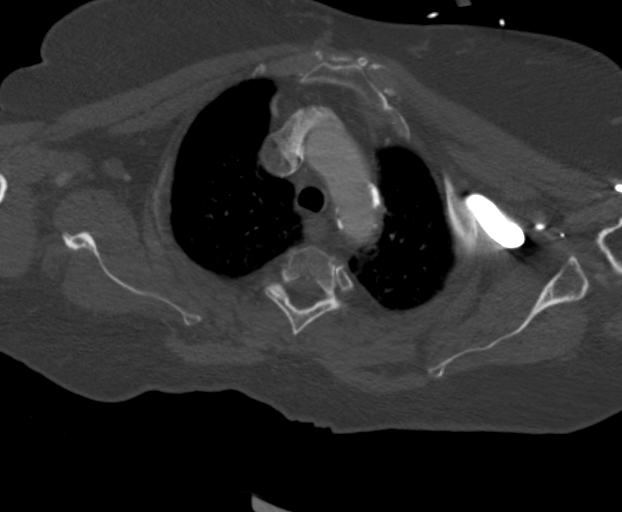
[im 170/397  soft-tissue]
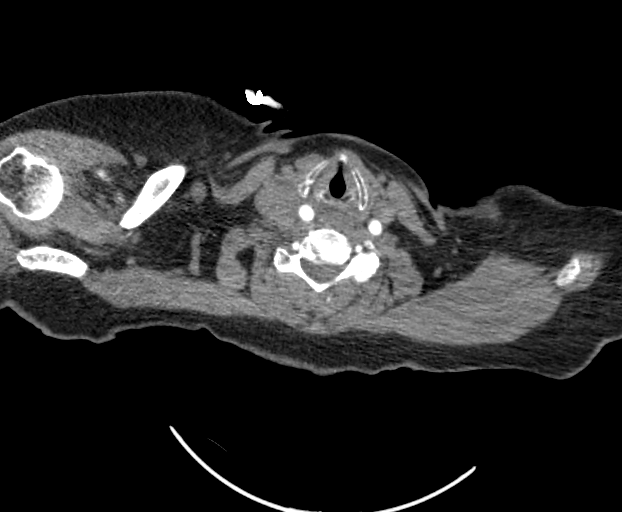
[im 227/397  bone]
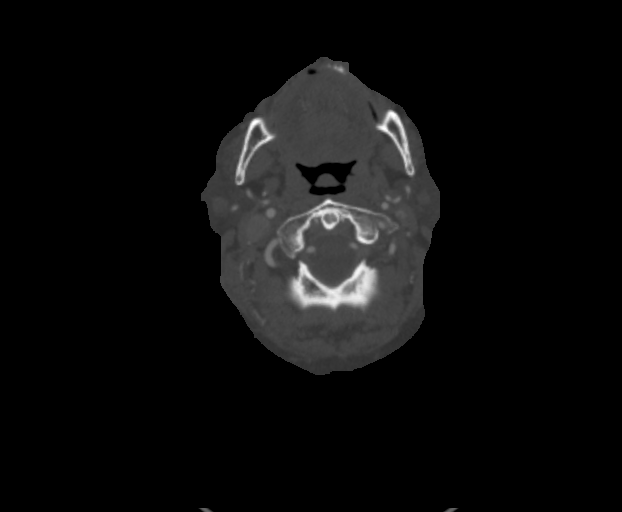
[im 283/397  soft-tissue]
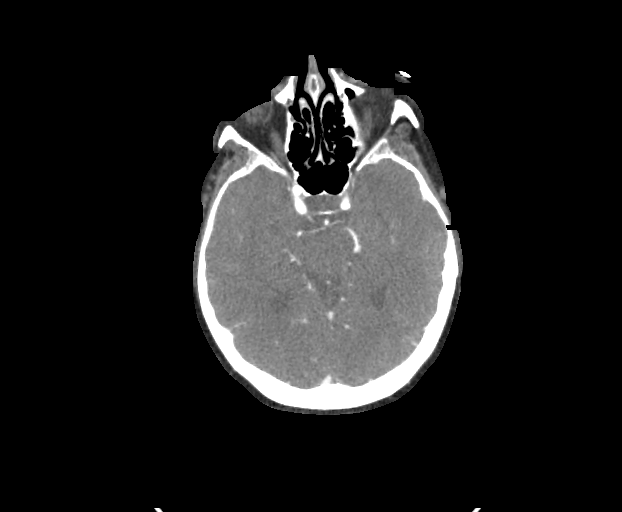
[im 340/397  bone]
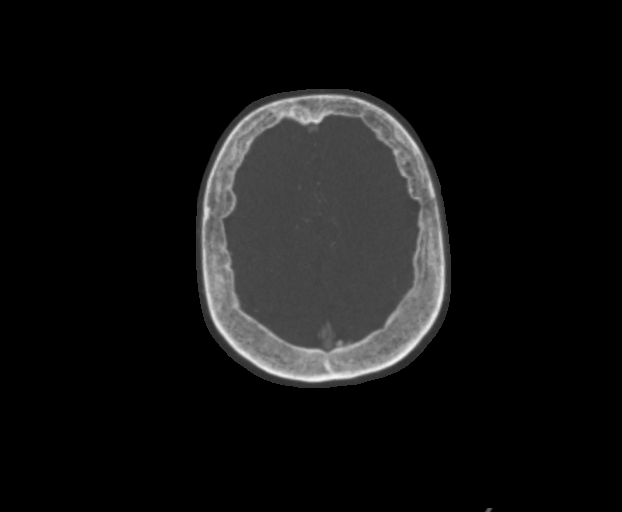

[6 of 35 positions shown; findings below may reference images not displayed]

FINDINGS: CT HEAD FINDINGS

Brain: No evidence of acute infarction, hemorrhage, hydrocephalus,
extra-axial collection or mass lesion/mass effect.

Vascular: No hyperdense vessel or unexpected calcification.

Skull: Advanced bilateral TMJ osteoarthritis with bulky spurring.
Hyperostosis interna.

Sinuses: Clear

Orbits: Gaze to the right.

Review of the MIP images confirms the above findings

CTA NECK FINDINGS

Aortic arch: No acute finding. Four vessel branching with left
vertebral artery arising from the arch

Right carotid system: Mild atheromatous plaque at the ICA bulb. ICA
tortuosity towards the skull base. No beading or ulceration.

Left carotid system: Mild atherosclerosis

Vertebral arteries: No proximal subclavian stenosis on the right.
The left vertebral artery arises from the arch. Calcified plaque at
the left vertebral origin with streak artifact limiting lumen
visualization, estimated at mild or moderate.

Skeleton: Cervical spine degeneration with mild C3-4
anterolisthesis.

Other neck: No acute finding.

Upper chest: Negative

Review of the MIP images confirms the above findings

CTA HEAD FINDINGS

Anterior circulation: Plaque along the carotid siphons. No branch
occlusion, flow limiting stenosis, beading, or aneurysm.

Posterior circulation: The vertebral and basilar arteries are smooth
and widely patent. Hypoplastic right P1 segment with fetal type PCA
flow. No branch occlusion, beading, or aneurysm.

Venous sinuses: Patent

Anatomic variants: Early branching MCA on the left.

Critical Value/emergent results were called by telephone at the time
of interpretation on 09/29/2020 at [DATE] to provider JULLON LIENAD
, who verbally acknowledged these results.

Review of the MIP images confirms the above findings
IMPRESSION: Head CT:

No hemorrhage or visible infarct.

CTA:

1. No emergent finding including large vessel occlusion.
2. Atherosclerosis without flow limiting stenosis of major vessels.

## 2022-09-16 ENCOUNTER — Emergency Department: Payer: Medicare HMO

## 2022-09-16 ENCOUNTER — Emergency Department
Admission: EM | Admit: 2022-09-16 | Discharge: 2022-09-16 | Disposition: A | Discharge status: Home / Self Care | Payer: Medicare HMO | Attending: Student in an Organized Health Care Education/Training Program | Admitting: Student in an Organized Health Care Education/Training Program

## 2022-09-16 ENCOUNTER — Other Ambulatory Visit: Payer: Self-pay

## 2022-09-16 ENCOUNTER — Encounter: Payer: Self-pay | Admitting: Intensive Care

## 2022-09-16 DIAGNOSIS — W19XXXA Unspecified fall, initial encounter: Secondary | ICD-10-CM | POA: Diagnosis not present

## 2022-09-16 DIAGNOSIS — S022XXA Fracture of nasal bones, initial encounter for closed fracture: Secondary | ICD-10-CM

## 2022-09-16 DIAGNOSIS — S0992XA Unspecified injury of nose, initial encounter: Secondary | ICD-10-CM | POA: Diagnosis present

## 2022-09-16 NOTE — ED Triage Notes (Signed)
Patient reports falling 09/08/22 due to her vertigo and feeling dizzy. Reports headaches ever since fall. Bruising noted under bilateral eyes and nose. Patient drove to ER

## 2022-09-16 NOTE — ED Provider Notes (Signed)
[facility] Provider Note    None    (approximate)   History   Fall and Headache   HPI  Caroline Miller is a 76 y.o. female presents the ER for evaluation of bruising of her face after a fall roughly 1 week ago.  Was sent to the ER by PCP for imaging and further evaluation of traumatic injuries.  States that this was a mechanical fall.  States that she has a long history of vertigo and was having vertiginous symptoms when she was walking down the hall with nothing to grab onto it she fell forward.  Denies any arm pain leg pain chest pain or palpitations no nausea or vomiting no numbness or tingling.     Physical Exam   Triage Vital Signs: ED Triage Vitals  Enc Vitals Group     BP 09/16/22 1401 116/71     Pulse Rate 09/16/22 1401 68     Resp 09/16/22 1401 16     Temp 09/16/22 1401 98.7 F (37.1 C)     Temp Source 09/16/22 1401 Oral     SpO2 09/16/22 1401 98 %     Weight 09/16/22 1402 150 lb (68 kg)     Height 09/16/22 1402 5\' 2"  (1.575 m)     Head Circumference --      Peak Flow --      Pain Score 09/16/22 1401 6     Pain Loc --      Pain Edu? --      Excl. in GC? --     Most recent vital signs: Vitals:   09/16/22 1530 09/16/22 1600  BP: (!) 140/82 115/82  Pulse: (!) 56 (!) 59  Resp:    Temp:    SpO2: 100% 100%     Constitutional: Alert  Eyes: Conjunctivae are normal. No proptosis Head: Contusion bridge of her nose with age-indeterminate ecchymosis of bilateral eyes.  Nose: No congestion/rhinnorhea.  No septal hematoma Mouth/Throat: Mucous membranes are moist.   Neck: Painless ROM.  Cardiovascular:   Good peripheral circulation. Respiratory: Normal respiratory effort.  No retractions.  Gastrointestinal: Soft and nontender.  Musculoskeletal:  no deformity Neurologic:  MAE spontaneously. No gross focal neurologic deficits are appreciated.  Skin:  Skin is warm, dry and intact. No rash noted. Psychiatric: Mood and affect are normal.  Speech and behavior are normal.    ED Results / Procedures / Treatments   Labs (all labs ordered are listed, but only abnormal results are displayed) Labs Reviewed  CBC WITH DIFFERENTIAL/PLATELET  COMPREHENSIVE METABOLIC PANEL     EKG     RADIOLOGY Please see ED Course for my review and interpretation.  I personally reviewed all radiographic images ordered to evaluate for the above acute complaints and reviewed radiology reports and findings.  These findings were personally discussed with the patient.  Please see medical record for radiology report.    PROCEDURES:  Procedures   MEDICATIONS ORDERED IN ED: Medications - No data to display   IMPRESSION / MDM / ASSESSMENT AND PLAN / ED COURSE  I reviewed the triage vital signs and the nursing notes.                              Differential diagnosis includes, but is not limited to, contusion, fracture, SDH, IPH, concussion, electrolyte abnormality, anemia  Patient presented to the ER for evaluation of symptoms as described above.  This presenting  complaint could reflect a potentially life-threatening illness therefore the patient will be placed on continuous pulse oximetry and telemetry for monitoring.  Laboratory evaluation will be sent to evaluate for the above complaints.   CT imaging will be ordered for the above differential.   Clinical Course as of 09/16/22 1621  Mon Sep 16, 2022  1614 Patient is declining blood work.  CT imaging of the head on my review and interpretation does not show any evidence of SDH IPH.  Discussed my recommendation for blood work as it could explain precipitating factors such as electrolyte abnormalities but may make him more prone to recurrent falls.  Despite this she is refusing.  We discussed the results of her CT imaging.  There are findings consistent with nasal bone fractures.  This would be appropriate for outpatient follow-up. [PR]    Clinical Course User Index [PR] Merlyn Lot, MD     FINAL CLINICAL IMPRESSION(S) / ED DIAGNOSES   Final diagnoses:  Fall, initial encounter  Closed fracture of nasal bone, initial encounter     Rx / DC Orders   ED Discharge Orders     None        Note:  This document was prepared using Dragon voice recognition software and may include unintentional dictation errors.    Merlyn Lot, MD 09/16/22 [phone]

## 2022-09-16 NOTE — ED Notes (Addendum)
Pt refused blood work and EKG. Pt states she has a doctors appointment coming up soon.
